# Patient Record
Sex: Female | Born: 1950 | Race: White | Hispanic: No | Marital: Married | Smoking: Former smoker
Health system: Southern US, Community
[De-identification: ages and names within clinical notes are randomized; demographics above are authoritative.]

## PROBLEM LIST (undated history)

## (undated) DIAGNOSIS — I10 Essential (primary) hypertension: Secondary | ICD-10-CM

## (undated) DIAGNOSIS — M8580 Other specified disorders of bone density and structure, unspecified site: Secondary | ICD-10-CM

## (undated) DIAGNOSIS — Z803 Family history of malignant neoplasm of breast: Secondary | ICD-10-CM

## (undated) DIAGNOSIS — I341 Nonrheumatic mitral (valve) prolapse: Secondary | ICD-10-CM

## (undated) DIAGNOSIS — K219 Gastro-esophageal reflux disease without esophagitis: Secondary | ICD-10-CM

## (undated) DIAGNOSIS — E785 Hyperlipidemia, unspecified: Secondary | ICD-10-CM

## (undated) DIAGNOSIS — Z8042 Family history of malignant neoplasm of prostate: Secondary | ICD-10-CM

## (undated) DIAGNOSIS — D1771 Benign lipomatous neoplasm of kidney: Secondary | ICD-10-CM

## (undated) DIAGNOSIS — Q615 Medullary cystic kidney: Secondary | ICD-10-CM

## (undated) DIAGNOSIS — T7840XA Allergy, unspecified, initial encounter: Secondary | ICD-10-CM

## (undated) DIAGNOSIS — M792 Neuralgia and neuritis, unspecified: Secondary | ICD-10-CM

## (undated) HISTORY — DX: Other specified disorders of bone density and structure, unspecified site: M85.80

## (undated) HISTORY — DX: Allergy, unspecified, initial encounter: T78.40XA

## (undated) HISTORY — PX: TONSILLECTOMY: SUR1361

## (undated) HISTORY — DX: Nonrheumatic mitral (valve) prolapse: I34.1

## (undated) HISTORY — DX: Family history of malignant neoplasm of breast: Z80.3

## (undated) HISTORY — DX: Essential (primary) hypertension: I10

## (undated) HISTORY — DX: Benign lipomatous neoplasm of kidney: D17.71

## (undated) HISTORY — DX: Family history of malignant neoplasm of prostate: Z80.42

## (undated) HISTORY — DX: Hyperlipidemia, unspecified: E78.5

## (undated) HISTORY — DX: Medullary cystic kidney: Q61.5

---

## 1999-06-05 ENCOUNTER — Ambulatory Visit (HOSPITAL_BASED_OUTPATIENT_CLINIC_OR_DEPARTMENT_OTHER): Admission: RE | Admit: 1999-06-05 | Discharge: 1999-06-05 | Payer: Self-pay | Admitting: *Deleted

## 2000-11-26 ENCOUNTER — Other Ambulatory Visit: Admission: RE | Admit: 2000-11-26 | Discharge: 2000-11-26 | Payer: Self-pay | Admitting: *Deleted

## 2001-12-28 ENCOUNTER — Other Ambulatory Visit: Admission: RE | Admit: 2001-12-28 | Discharge: 2001-12-28 | Payer: Self-pay | Admitting: *Deleted

## 2002-05-20 ENCOUNTER — Encounter: Payer: Self-pay | Admitting: Internal Medicine

## 2002-05-20 ENCOUNTER — Encounter: Admission: RE | Admit: 2002-05-20 | Discharge: 2002-05-20 | Payer: Self-pay | Admitting: Internal Medicine

## 2002-06-09 HISTORY — PX: CYSTOSCOPY: SUR368

## 2002-07-06 ENCOUNTER — Encounter: Admission: RE | Admit: 2002-07-06 | Discharge: 2002-07-06 | Payer: Self-pay | Admitting: Urology

## 2002-07-06 ENCOUNTER — Encounter: Payer: Self-pay | Admitting: Urology

## 2004-05-29 ENCOUNTER — Encounter: Admission: RE | Admit: 2004-05-29 | Discharge: 2004-08-27 | Payer: Self-pay | Admitting: Internal Medicine

## 2004-08-22 ENCOUNTER — Encounter (INDEPENDENT_AMBULATORY_CARE_PROVIDER_SITE_OTHER): Payer: Self-pay | Admitting: *Deleted

## 2004-08-22 ENCOUNTER — Ambulatory Visit (HOSPITAL_COMMUNITY): Admission: RE | Admit: 2004-08-22 | Discharge: 2004-08-22 | Payer: Self-pay | Admitting: Gastroenterology

## 2005-03-10 ENCOUNTER — Encounter: Admission: RE | Admit: 2005-03-10 | Discharge: 2005-03-10 | Payer: Self-pay | Admitting: Family Medicine

## 2005-03-10 ENCOUNTER — Ambulatory Visit: Payer: Self-pay | Admitting: Family Medicine

## 2005-06-05 ENCOUNTER — Ambulatory Visit: Payer: Self-pay | Admitting: Internal Medicine

## 2005-06-11 ENCOUNTER — Ambulatory Visit: Payer: Self-pay | Admitting: Internal Medicine

## 2005-06-13 ENCOUNTER — Encounter: Payer: Self-pay | Admitting: Internal Medicine

## 2005-07-01 ENCOUNTER — Ambulatory Visit: Payer: Self-pay | Admitting: Internal Medicine

## 2005-07-10 ENCOUNTER — Ambulatory Visit: Payer: Self-pay | Admitting: Internal Medicine

## 2005-09-22 ENCOUNTER — Ambulatory Visit: Payer: Self-pay | Admitting: Internal Medicine

## 2005-09-25 ENCOUNTER — Ambulatory Visit: Payer: Self-pay | Admitting: Internal Medicine

## 2005-12-12 ENCOUNTER — Ambulatory Visit: Payer: Self-pay | Admitting: Internal Medicine

## 2006-03-26 ENCOUNTER — Ambulatory Visit: Payer: Self-pay | Admitting: Internal Medicine

## 2006-03-26 LAB — CONVERTED CEMR LAB
ALT: 25 U/L
AST: 27 U/L
Chol/HDL Ratio, serum: 2.5
Cholesterol: 224 mg/dL
HDL: 88.3 mg/dL
LDL DIRECT: 110.8 mg/dL
Triglyceride fasting, serum: 51 mg/dL
VLDL: 10 mg/dL

## 2006-04-14 ENCOUNTER — Ambulatory Visit: Payer: Self-pay | Admitting: Internal Medicine

## 2006-07-20 ENCOUNTER — Ambulatory Visit: Payer: Self-pay | Admitting: Internal Medicine

## 2006-07-20 ENCOUNTER — Encounter: Admission: RE | Admit: 2006-07-20 | Discharge: 2006-07-20 | Payer: Self-pay | Admitting: Internal Medicine

## 2006-07-20 LAB — CONVERTED CEMR LAB: Uric Acid, Serum: 2.9 mg/dL (ref 2.4–7.0)

## 2006-09-15 ENCOUNTER — Encounter: Admission: RE | Admit: 2006-09-15 | Discharge: 2006-12-14 | Payer: Self-pay | Admitting: Internal Medicine

## 2006-12-18 ENCOUNTER — Encounter: Payer: Self-pay | Admitting: Internal Medicine

## 2007-01-05 ENCOUNTER — Encounter: Payer: Self-pay | Admitting: Internal Medicine

## 2007-02-16 ENCOUNTER — Telehealth: Payer: Self-pay | Admitting: Internal Medicine

## 2007-02-16 ENCOUNTER — Telehealth (INDEPENDENT_AMBULATORY_CARE_PROVIDER_SITE_OTHER): Payer: Self-pay | Admitting: *Deleted

## 2007-02-24 ENCOUNTER — Ambulatory Visit: Payer: Self-pay | Admitting: Internal Medicine

## 2007-02-27 LAB — CONVERTED CEMR LAB
Albumin: 4.3 g/dL (ref 3.5–5.2)
Basophils Absolute: 0 10*3/uL (ref 0.0–0.1)
Cholesterol: 211 mg/dL (ref 0–200)
Direct LDL: 105.1 mg/dL
Eosinophils Absolute: 0.1 10*3/uL (ref 0.0–0.6)
GFR calc Af Amer: 95 mL/min
GFR calc non Af Amer: 79 mL/min
HCT: 40.7 % (ref 36.0–46.0)
Hemoglobin: 14.1 g/dL (ref 12.0–15.0)
MCHC: 34.5 g/dL (ref 30.0–36.0)
MCV: 93 fL (ref 78.0–100.0)
Monocytes Absolute: 0.6 10*3/uL (ref 0.2–0.7)
Monocytes Relative: 10.7 % (ref 3.0–11.0)
Neutro Abs: 3 10*3/uL (ref 1.4–7.7)
Neutrophils Relative %: 57.8 % (ref 43.0–77.0)
Potassium: 3.9 meq/L (ref 3.5–5.1)
Sodium: 144 meq/L (ref 135–145)
TSH: 3.03 microintl units/mL (ref 0.35–5.50)
Total Bilirubin: 1 mg/dL (ref 0.3–1.2)

## 2007-03-01 ENCOUNTER — Encounter (INDEPENDENT_AMBULATORY_CARE_PROVIDER_SITE_OTHER): Payer: Self-pay | Admitting: *Deleted

## 2007-03-03 ENCOUNTER — Ambulatory Visit: Payer: Self-pay | Admitting: Internal Medicine

## 2007-03-03 DIAGNOSIS — E782 Mixed hyperlipidemia: Secondary | ICD-10-CM | POA: Insufficient documentation

## 2007-03-03 LAB — CONVERTED CEMR LAB
Cholesterol, target level: 200 mg/dL
HDL goal, serum: 40 mg/dL

## 2007-06-10 HISTORY — PX: TOE SURGERY: SHX1073

## 2007-07-30 ENCOUNTER — Encounter: Payer: Self-pay | Admitting: Internal Medicine

## 2008-01-05 ENCOUNTER — Encounter: Payer: Self-pay | Admitting: Internal Medicine

## 2008-01-14 ENCOUNTER — Telehealth (INDEPENDENT_AMBULATORY_CARE_PROVIDER_SITE_OTHER): Payer: Self-pay | Admitting: *Deleted

## 2008-01-14 ENCOUNTER — Ambulatory Visit: Payer: Self-pay | Admitting: Internal Medicine

## 2008-01-19 ENCOUNTER — Ambulatory Visit: Payer: Self-pay | Admitting: Internal Medicine

## 2008-01-23 LAB — CONVERTED CEMR LAB
Direct LDL: 136.2 mg/dL
Glucose, Bld: 90 mg/dL (ref 70–99)
HDL: 62.6 mg/dL (ref >39.0)

## 2008-01-24 ENCOUNTER — Encounter (INDEPENDENT_AMBULATORY_CARE_PROVIDER_SITE_OTHER): Payer: Self-pay | Admitting: *Deleted

## 2008-02-08 ENCOUNTER — Ambulatory Visit: Payer: Self-pay | Admitting: Internal Medicine

## 2008-02-08 LAB — CONVERTED CEMR LAB: HDL goal, serum: 50 mg/dL

## 2008-02-29 ENCOUNTER — Other Ambulatory Visit: Admission: RE | Admit: 2008-02-29 | Discharge: 2008-02-29 | Payer: Self-pay | Admitting: Obstetrics and Gynecology

## 2008-04-26 ENCOUNTER — Ambulatory Visit: Payer: Self-pay | Admitting: Internal Medicine

## 2008-06-13 ENCOUNTER — Ambulatory Visit: Payer: Self-pay | Admitting: Internal Medicine

## 2008-06-22 ENCOUNTER — Telehealth (INDEPENDENT_AMBULATORY_CARE_PROVIDER_SITE_OTHER): Payer: Self-pay | Admitting: *Deleted

## 2008-12-08 ENCOUNTER — Ambulatory Visit: Payer: Self-pay | Admitting: Internal Medicine

## 2008-12-08 DIAGNOSIS — M542 Cervicalgia: Secondary | ICD-10-CM

## 2008-12-13 ENCOUNTER — Ambulatory Visit: Payer: Self-pay | Admitting: Internal Medicine

## 2008-12-15 ENCOUNTER — Encounter (INDEPENDENT_AMBULATORY_CARE_PROVIDER_SITE_OTHER): Payer: Self-pay | Admitting: *Deleted

## 2009-01-04 ENCOUNTER — Encounter: Payer: Self-pay | Admitting: Internal Medicine

## 2009-01-10 ENCOUNTER — Encounter: Payer: Self-pay | Admitting: Internal Medicine

## 2009-04-26 ENCOUNTER — Telehealth: Payer: Self-pay | Admitting: Internal Medicine

## 2009-07-09 ENCOUNTER — Emergency Department (HOSPITAL_BASED_OUTPATIENT_CLINIC_OR_DEPARTMENT_OTHER): Admission: EM | Admit: 2009-07-09 | Discharge: 2009-07-09 | Payer: Self-pay | Admitting: Emergency Medicine

## 2009-07-09 ENCOUNTER — Ambulatory Visit: Payer: Self-pay | Admitting: Family Medicine

## 2009-07-09 ENCOUNTER — Ambulatory Visit: Payer: Self-pay | Admitting: Diagnostic Radiology

## 2009-08-13 ENCOUNTER — Encounter: Payer: Self-pay | Admitting: Internal Medicine

## 2009-08-14 ENCOUNTER — Telehealth (INDEPENDENT_AMBULATORY_CARE_PROVIDER_SITE_OTHER): Payer: Self-pay | Admitting: *Deleted

## 2009-09-11 ENCOUNTER — Ambulatory Visit: Payer: Self-pay | Admitting: Internal Medicine

## 2009-09-18 ENCOUNTER — Ambulatory Visit: Payer: Self-pay | Admitting: Internal Medicine

## 2009-09-18 DIAGNOSIS — I10 Essential (primary) hypertension: Secondary | ICD-10-CM | POA: Insufficient documentation

## 2009-09-18 DIAGNOSIS — M949 Disorder of cartilage, unspecified: Secondary | ICD-10-CM

## 2009-09-18 DIAGNOSIS — M899 Disorder of bone, unspecified: Secondary | ICD-10-CM | POA: Insufficient documentation

## 2009-09-18 DIAGNOSIS — F4323 Adjustment disorder with mixed anxiety and depressed mood: Secondary | ICD-10-CM

## 2009-09-18 DIAGNOSIS — J310 Chronic rhinitis: Secondary | ICD-10-CM | POA: Insufficient documentation

## 2009-09-24 LAB — CONVERTED CEMR LAB
CRP: 0.1 mg/dL (ref <0.6)
Vit D, 25-Hydroxy: 48 ng/mL (ref 30–89)

## 2009-12-04 ENCOUNTER — Encounter: Payer: Self-pay | Admitting: Internal Medicine

## 2010-01-16 ENCOUNTER — Encounter: Payer: Self-pay | Admitting: Internal Medicine

## 2010-02-08 ENCOUNTER — Ambulatory Visit: Payer: Self-pay | Admitting: Internal Medicine

## 2010-02-08 DIAGNOSIS — M25559 Pain in unspecified hip: Secondary | ICD-10-CM | POA: Insufficient documentation

## 2010-02-13 ENCOUNTER — Encounter: Payer: Self-pay | Admitting: Internal Medicine

## 2010-02-25 ENCOUNTER — Ambulatory Visit: Payer: Self-pay | Admitting: Internal Medicine

## 2010-02-25 DIAGNOSIS — R5383 Other fatigue: Secondary | ICD-10-CM

## 2010-02-25 DIAGNOSIS — K219 Gastro-esophageal reflux disease without esophagitis: Secondary | ICD-10-CM

## 2010-02-25 DIAGNOSIS — R1013 Epigastric pain: Secondary | ICD-10-CM

## 2010-02-25 DIAGNOSIS — R5381 Other malaise: Secondary | ICD-10-CM

## 2010-03-13 ENCOUNTER — Encounter: Payer: Self-pay | Admitting: Internal Medicine

## 2010-03-14 LAB — HM PAP SMEAR

## 2010-03-25 ENCOUNTER — Encounter (INDEPENDENT_AMBULATORY_CARE_PROVIDER_SITE_OTHER): Payer: Self-pay | Admitting: *Deleted

## 2010-03-25 ENCOUNTER — Ambulatory Visit: Payer: Self-pay | Admitting: Internal Medicine

## 2010-03-25 LAB — CONVERTED CEMR LAB
OCCULT 2: NEGATIVE
OCCULT 3: NEGATIVE

## 2010-04-11 ENCOUNTER — Ambulatory Visit (HOSPITAL_COMMUNITY): Admission: RE | Admit: 2010-04-11 | Discharge: 2010-04-11 | Payer: Self-pay | Admitting: Gastroenterology

## 2010-06-09 HISTORY — PX: HYSTEROSCOPY WITH RESECTOSCOPE: SHX5395

## 2010-07-02 LAB — CBC
HCT: 43.2 % (ref 36.0–46.0)
Hemoglobin: 14.6 g/dL (ref 12.0–15.0)
MCH: 29.9 pg (ref 26.0–34.0)
MCHC: 33.8 g/dL (ref 30.0–36.0)
MCV: 88.5 fL (ref 78.0–100.0)
Platelets: 199 K/uL (ref 150–400)
RBC: 4.88 MIL/uL (ref 3.87–5.11)
RDW: 12.8 % (ref 11.5–15.5)
WBC: 5.6 K/uL (ref 4.0–10.5)

## 2010-07-02 LAB — BASIC METABOLIC PANEL
BUN: 13 mg/dL (ref 6–23)
CO2: 27 mEq/L (ref 19–32)
Calcium: 9.2 mg/dL (ref 8.4–10.5)
Chloride: 105 mEq/L (ref 96–112)
Creatinine, Ser: 0.54 mg/dL (ref 0.4–1.2)
GFR calc Af Amer: >60 mL/min (ref >60)

## 2010-07-07 LAB — CONVERTED CEMR LAB
Alkaline Phosphatase: 66 units/L (ref 39–117)
Basophils Absolute: 0 10*3/uL (ref 0.0–0.1)
Basophils Absolute: 0.1 10*3/uL (ref 0.0–0.1)
Basophils Relative: 1 % (ref 0.0–3.0)
Bilirubin, Direct: 0 mg/dL (ref 0.0–0.3)
Bilirubin, Direct: 0.1 mg/dL (ref 0.0–0.3)
Calcium: 8.9 mg/dL (ref 8.4–10.5)
GFR calc non Af Amer: 90.65 mL/min (ref >60)
H Pylori IgG: NEGATIVE
HCT: 41.5 % (ref 36.0–46.0)
Hemoglobin: 14.9 g/dL (ref 12.0–15.0)
Lymphocytes Relative: 29.8 % (ref 12.0–46.0)
Lymphs Abs: 1.6 10*3/uL (ref 0.7–4.0)
MCV: 93 fL (ref 78.0–100.0)
Monocytes Absolute: 0.4 10*3/uL (ref 0.1–1.0)
Monocytes Relative: 11.6 % (ref 3.0–12.0)
Monocytes Relative: 9.5 % (ref 3.0–12.0)
Neutro Abs: 3.2 10*3/uL (ref 1.4–7.7)
Platelets: 253 10*3/uL (ref 150.0–400.0)
RBC: 4.64 M/uL (ref 3.87–5.11)
RDW: 12.6 % (ref 11.5–14.6)
Sodium: 139 meq/L (ref 135–145)
Total Bilirubin: 0.6 mg/dL (ref 0.3–1.2)
Total Protein: 6.5 g/dL (ref 6.0–8.3)

## 2010-07-08 ENCOUNTER — Ambulatory Visit (HOSPITAL_COMMUNITY)
Admission: RE | Admit: 2010-07-08 | Discharge: 2010-07-08 | Payer: Self-pay | Source: Home / Self Care | Attending: Obstetrics & Gynecology | Admitting: Obstetrics & Gynecology

## 2010-07-11 NOTE — Assessment & Plan Note (Signed)
Summary: cpx/kdc   Vital Signs:  Patient profile:   60 year old female Height:      60.25 inches Weight:      127.6 pounds BMI:     24.80 Temp:     98.6 degrees F oral Pulse rate:   56 / minute Resp:     16 per minute BP sitting:   122 / 89  (left arm) Cuff size:   regular  Vitals Entered By: Shonna Chock (September 18, 2009 11:13 AM)  CC: CPX, discuss labs (copy given). Last EKG 12/08/2008 , General Medical Evaluation Comments REVIEWED MED LIST, PATIENT AGREED DOSE AND INSTRUCTION CORRECT    Primary Care Provider:  Alfonse Flavors  CC:  CPX, discuss labs (copy given). Last EKG 12/08/2008 , and General Medical Evaluation.  History of Present Illness: Megan Downs is here for a physical; she continues to have L neck pain intermittently. No evaluation or  Rx to date.  Preventive Screening-Counseling & Management  Alcohol-Tobacco     Smoking Status: quit  Caffeine-Diet-Exercise     Does Patient Exercise: yes  Allergies (verified): No Known Drug Allergies  Past History:  Past Medical History: Hyperlipidemia: NMR 2011:LDL 126(1486/141),TG 78, HDL 70. LDL goal= < 115, ideally <85. 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 Payor Allergic rhinitis Osteopenia; Scoliosis  Past Surgical History: Colonoscopy negative  2005 , Dr Reece Agar; cystoscopy for microscopic hematuria; G2 P2 Tonsillectomy Toe surgery 11/2007, Dr Lajoyce Corners  Family History: Father:esophageal cancer  Mother: breast cancer Siblings: 1 bro: prostate cancer; 2 bro: lung cancer(1 had been in [place]); daughter : Cystic Fibrosis Carrier; CAD in 3 GP, none premature  Social History: Occupation:Small Business Owner Married Former Smoker: 1.5 years Alcohol use-yes:socially Regular exercise-yes: yoga  Review of Systems  The patient denies anorexia, fever, weight loss, weight gain, vision loss, decreased hearing, hoarseness, chest pain, syncope, dyspnea on exertion,  peripheral edema, prolonged cough, hemoptysis, abdominal pain, melena, hematochezia, severe indigestion/heartburn, suspicious skin lesions, unusual weight change, abnormal bleeding, enlarged lymph nodes, and angioedema.         Headaches & dry cough with pollen exposure; Claritin helps. GU:  Denies discharge, dysuria, hematuria, incontinence, nocturia, and urinary frequency. MS:  Complains of joint pain and low back pain; denies joint redness, joint swelling, and loss of strength. Neuro:  Denies brief paralysis, tingling, and weakness; Numbness L hip after prolonged sitting. No LUE radiculopathy symptoms. Psych:  Complains of anxiety, depression, easily angered, easily tearful, and irritability; Major stress of caring for demented Mother-in -law.  Physical Exam  General:  well-nourished; alert,appropriate and cooperative throughout examination Head:  Normocephalic and atraumatic without obvious abnormalities. Eyes:  No corneal or conjunctival inflammation noted. Perrla. Funduscopic exam benign, without hemorrhages, exudates or papilledema. Vision grossly normal. Ears:  External ear exam shows no significant lesions or deformities.  Otoscopic examination reveals clear canals, tympanic membranes are intact bilaterally without bulging, retraction, inflammation or discharge. Hearing is grossly normal bilaterally. Nose:  External nasal examination shows no deformity or inflammation. Nasal mucosa are pink and moist without lesions or exudates. Slight septal dislocation Mouth:  Oral mucosa and oropharynx without lesions or exudates.  Teeth in good repair. Neck:  No deformities, masses, or tenderness noted. Lungs:  Normal respiratory effort, chest expands symmetrically. Lungs are clear to auscultation, no crackles or wheezes. Heart:  normal rate, regular rhythm, no gallop, no rub, no JVD, no HJR, and grade 1 /6 systolic murmur.  Abdomen:  Bowel sounds positive,abdomen soft and non-tender without masses,  organomegaly or hernias noted. Genitalia:  [facility], Shirlyn Goltz , NP Msk:  Reverse thoracic curvature; L paraspinus muscles larger than R Pulses:  R and L carotid,radial,dorsalis pedis and posterior tibial pulses are full and equal bilaterally Extremities:  No clubbing, cyanosis, edema, or deformity noted with normal full range of motion of all joints.   minor crepitus L knee Neurologic:  alert & oriented X3, strength normal in all extremities, and DTRs symmetrical and normal.   Skin:  Intact without suspicious lesions or rashes Cervical Nodes:  No lymphadenopathy noted Axillary Nodes:  No palpable lymphadenopathy Psych:  memory intact for recent and remote, normally interactive, good eye contact, and not depressed appearing.  Appropriate response to home situation   Impression & Recommendations:  Problem # 1:  PREVENTIVE HEALTH CARE (ICD-V70.0)  Orders: Radiology Referral (Radiology) Venipuncture [phone]) T-Vitamin D (25-Hydroxy) [phone]) T- * Misc. Laboratory test [phone])  Problem # 2:  HYPERTENSION (ICD-401.9)  Her updated medication list for this problem includes:    Metoprolol Tartrate 25 Mg Tabs (Metoprolol tartrate) .Marland Kitchen... 1 two times a day to keep bp < 135/85 on average  Problem # 3:  HYPERLIPIDEMIA (ICD-272.2)  Orders: T- * Misc. 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 1 by mouth 6)  Metoprolol Tartrate 25 Mg Tabs (Metoprolol tartrate) .Marland Kitchen.. 1 two times a day to keep bp < 135/85 on average 7)  Cymbalta 60 Mg Cpep (Duloxetine hcl) .Marland Kitchen.. 1 once daily  Other Orders: Tdap => 34yrs IM  (44010) Admin 1st Vaccine (27253)  Patient Instructions: 1)  Check your Blood Pressure regularly. If it is above: 135/85 ON AVERAGE you should make an appointment.Verify colonoscopy due date with Dr Henriette Combs office. Prescriptions: CYMBALTA 60 MG CPEP (DULOXETINE HCL) 1 once daily  #30 x 5   Entered and Authorized by:   Marga Melnick MD   Signed by:   Marga Melnick MD on 09/18/2009   Method used:   Print then Give to Patient   RxID:   [id number] METOPROLOL TARTRATE 25 MG TABS (METOPROLOL TARTRATE) 1 two times a day to keep BP < 135/85 ON AVERAGE  #180 x 3   Entered and Authorized by:   Marga Melnick MD   Signed by:   Marga Melnick MD on 09/18/2009   Method used:   Faxed to ...       [facility]  [address] [phone]* (retail)       [address]       [place], [place]  [address]       Ph: [phone]       Fax: [phone]   RxID:   [id number]    Immunizations Administered:  Tetanus Vaccine:    Vaccine Type: Tdap    Site: right deltoid    Mfr: GlaxoSmithKline    Dose: 0.5 ml    Route: IM    Given by: Chrae Malloy    Exp. Date: 09/01/2011    Lot #: [id number]    VIS given: 04/27/07 version given September 18, 2009.

## 2010-07-11 NOTE — Assessment & Plan Note (Signed)
Summary: REFLUX--NEEDS REFERRAL.///SPH   Vital Signs:  Patient profile:   60 year old female Weight:      128.2 pounds Temp:     98.6 degrees F oral Pulse rate:   72 / minute Resp:     13 per minute BP sitting:   112 / 70  (left arm) Cuff size:   regular  Vitals Entered By: Shonna Chock CMA (February 25, 2010 10:43 AM) CC: 1.) Reflux  2.) Discuss Metoprolol, Heartburn   Primary Care Provider:  Alfonse Flavors  CC:  1.) Reflux  2.) Discuss Metoprolol and Heartburn.  History of Present Illness:      This is a 60 year old woman who presents with "acid reflux" for 6-8 months, worse inpast 2 months.  The patient reports  epigastric pain,  chest pain , and trouble swallowing, but denies sour taste in mouth, weight loss or  weight gain. She has had some symptoms with physical exertion. The patient reports the following alarm features of dyspepsia: dysphagia as "food sticking " X 2 .  The patient denies the following alarm features: melena, hematemesis, and vomiting.  Symptoms are worse with alcohol & waist flexion or lifting heavy items.No prior evaluation to date.  The patient has found the following treatments to be effective: elevating the head of bed.  Colonoscopy was negative by Dr Laural Benes in 08/2009.  Hypertension Follow-Up      The patient also presents for Hypertension follow-up.  The patient reports urinary frequency and fatigue, but denies lightheadedness and headaches.  The patient denies the following associated symptoms: exercise intolerance, dyspnea, palpitations, leg edema, and pedal edema.  The patient reports exercising daily.  Adjunctive measures currently used by the patient include salt restriction.  She has cut Metoprolol to 25 mg to 1/2 two times a day. BP averages < 120/85.  Current Medications (verified): 1)  Omega 3 2)  Calcium and Magnesium 3)  Vit D 4)  Asa 81mg  5)  Vitamin E 400 Unit Caps (Vitamin E) .Marland Kitchen.. 1 By Mouth 6)  Metoprolol Tartrate 25 Mg Tabs (Metoprolol Tartrate)  .... 1/2 Two Times A Day To Keep Bp < 135/85 On Average 7)  Cymbalta 60 Mg Cpep (Duloxetine Hcl) .Marland Kitchen.. 1 Once Daily 8)  Tramadol Hcl 50 Mg Tabs (Tramadol Hcl) 9)  Meloxicam 7.5 Mg Tabs (Meloxicam) .Marland Kitchen.. 1 Two Times A Day As Needed Hippain  Allergies (verified): No Known Drug Allergies  Physical Exam  General:  well-nourished,in no acute distress; alert,appropriate and cooperative throughout examination Eyes:  No corneal or conjunctival inflammation noted. Minimal lid lag.Perrla. No icterus Mouth:  Oral mucosa and oropharynx without lesions or exudates.  Teeth in good repair. No pharyngeal erythema.   Neck:  No deformities, masses, or tenderness noted. Lungs:  Normal respiratory effort, chest expands symmetrically. Lungs are clear to auscultation, no crackles or wheezes. Heart:  Normal rate and regular rhythm. S1 and S2 normal without gallop, murmur, click, rub.S4 Abdomen:  Bowel sounds positive,abdomen soft and non-tender without masses, organomegaly or hernias noted. Pulses:  R and L carotid,radial,dorsalis pedis and posterior tibial pulses are full and equal bilaterally Extremities:  No clubbing, cyanosis, edema. Neurologic:  alert & oriented X3 and DTRs symmetrical and normal.   Skin:  Intact without suspicious lesions or rashes. No jaundice Cervical Nodes:  No lymphadenopathy noted Axillary Nodes:  No palpable lymphadenopathy Psych:  memory intact for recent and remote, normally interactive, and good eye contact.     Impression & Recommendations:  Problem #  1:  GERD (ICD-530.81)  Orders: Venipuncture (16109) TLB-H. Pylori Abs(Helicobacter Pylori) (86677-HELICO) Specimen Handling (60454)  Her updated medication list for this problem includes:    Ranitidine Hcl 150 Mg Tabs (Ranitidine hcl) .Marland Kitchen... 1 two times a day pre meals  Problem # 2:  HYPERTENSION (ICD-401.9)  Her updated medication list for this problem includes:    Metoprolol Tartrate 25 Mg Tabs (Metoprolol tartrate)  .Marland Kitchen... 1/2 two times a day to keep bp < 135/85 on average  Orders: Venipuncture (09811)  Problem # 3:  FATIGUE (ICD-780.79)  ? from Beta blocker  Orders: Venipuncture (91478) TLB-CBC Platelet - w/Differential (85025-CBCD) TLB-TSH (Thyroid Stimulating Hormone) (84443-TSH) Specimen Handling (29562)  Problem # 4:  ABDOMINAL PAIN, EPIGASTRIC (ICD-789.06)  due to #1  Orders: EKG w/ Interpretation (93000) Venipuncture (13086) TLB-CBC Platelet - w/Differential (85025-CBCD) TLB-Hepatic/Liver Function Pnl (80076-HEPATIC) TLB-Amylase (82150-AMYL) TLB-Lipase (83690-LIPASE) TLB-H. Pylori Abs(Helicobacter Pylori) (86677-HELICO) Specimen Handling (57846)  Complete Medication List: 1)  Omega 3  2)  Calcium and Magnesium  3)  Vit D  4)  Asa 81mg   5)  Vitamin E 400 Unit Caps (Vitamin e) .Marland Kitchen.. 1 by mouth 6)  Metoprolol Tartrate 25 Mg Tabs (Metoprolol tartrate) .... 1/2 two times a day to keep bp < 135/85 on average 7)  Cymbalta 60 Mg Cpep (Duloxetine hcl) .Marland Kitchen.. 1 once daily 8)  Tramadol Hcl 50 Mg Tabs (Tramadol hcl) 9)  Meloxicam 7.5 Mg Tabs (Meloxicam) .Marland Kitchen.. 1 two times a day as needed hippain 10)  Ranitidine Hcl 150 Mg Tabs (Ranitidine hcl) .Marland Kitchen.. 1 two times a day pre meals  Patient Instructions: 1)  Complete stool cards.  2)  Avoid foods high in acid (tomatoes, citrus juices, spicy foods). Avoid eating within two hours of lying down or before exercising. Do not over eat; try smaller more frequent meals. Elevate head of bed twelve inches when sleeping. Prescriptions: RANITIDINE HCL 150 MG TABS (RANITIDINE HCL) 1 two times a day pre meals  #60 x 2   Entered and Authorized by:   Marga Melnick MD   Signed by:   Marga Melnick MD on 02/25/2010   Method used:   Faxed to ...       [facility]  [address] [phone]* (retail)       [address]       [place], [place]  [address]       Ph: [phone]       Fax: [phone]   RxID:   [id number]

## 2010-07-11 NOTE — Procedures (Signed)
Summary: Colonoscopy/[facility]  Colonoscopy/[facility]   Imported By: Lanelle Bal 12/19/2009 09:35:05  _____________________________________________________________________  External Attachment:    Type:   Image     Comment:   External Document

## 2010-07-11 NOTE — Assessment & Plan Note (Signed)
Summary: DISCUSS BONE DENSITY/SCM   Vital Signs:  Patient profile:   60 year old female Weight:      128.8 pounds Pulse rate:   60 / minute Resp:     14 per minute BP sitting:   104 / 68  (left arm) Cuff size:   regular  Vitals Entered By: Shonna Chock CMA (February 08, 2010 4:15 PM) CC: Discuss Bone Density (copy given), Lower Extremity Joint pain   Primary Care Provider:  Alfonse Flavors  CC:  Discuss Bone Density (copy given) and Lower Extremity Joint pain.  History of Present Illness: BMD reviewed ; T score -1.9 @ L femoral neck . No PMH of fractures. Fosamax was Rxed by Gyn ? 2001 but never taken . Yoga  & treadmill  exercise 3X/week. Last vitamin D level was 46 in 2009. Calcium 600 mg two times a day & ? amount vit D3.Pathophysiology of Osteoporosis.She has concerns about GI effects of bisphosphonates. Lower Extremity Joint Pain      This is a 60 year old woman who presents with L Hip Joint pain X 3-4 months.  The patient reports weakness, but denies swelling, redness, giving away, locking, popping, stiffness for >1 hr, and decreased ROM.  The pain began gradually and with no injury.  The pain is described as dull.  Evaluation to date has included no evaluation.  The patient denies the following symptoms: fever, rash, photosensitivity, eye symptoms, diarrhea, and dysuria. Rx: none.  Hypertension Follow-Up      The patient also presents for Hypertension follow-up. BP low despite decreasing Beta blocker to 25 mg daily. The patient reports waves of tiredness, but denies lightheadedness, urinary frequency, and headaches.  The patient denies the following associated symptoms: chest pain, chest pressure, exercise intolerance, dyspnea, palpitations, syncope, leg edema, and pedal edema.  Adjunctive measures currently used by the patient include salt restriction.    Current Medications (verified): 1)  Omega 3 2)  Calcium and Magnesium 3)  Vit D 4)  Asa 81mg  5)  Vitamin E 400 Unit Caps (Vitamin E)  .Marland Kitchen.. 1 By Mouth 6)  Metoprolol Tartrate 25 Mg Tabs (Metoprolol Tartrate) .Marland Kitchen.. 1 Two Times A Day To Keep Bp < 135/85 On Average 7)  Cymbalta 60 Mg Cpep (Duloxetine Hcl) .Marland Kitchen.. 1 Once Daily  Allergies (verified): No Known Drug Allergies  Past History:  Past Medical History: Hyperlipidemia: NMR 2011:LDL 126(1486/141),TG 78, HDL 70. LDL goal= < 115, ideally <85. 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 Payor Allergic rhinitis Osteopenia :BMD  2011  T score - 1.9 @ femoral neck; Scoliosis  Physical Exam  General:  well-nourished,in no acute distress; alert,appropriate and cooperative throughout examination Lungs:  Normal respiratory effort, chest expands symmetrically. Lungs are clear to auscultation, no crackles or wheezes. Heart:  normal rate, regular rhythm, no gallop, no rub, no JVD, no HJR, and grade 1 /6 systolic murmur.   Abdomen:  Bowel sounds positive,abdomen soft and non-tender without masses, organomegaly or hernias noted. No AAA or bruits Msk:  Scoliosis Pulses:  R and L carotid,radial,dorsalis pedis and posterior tibial pulses are full and equal bilaterally Extremities:  No clubbing, cyanosis, edema. Excellent &  full range of motion of all joints.    Psych:  memory intact for recent and remote, normally interactive, and good eye contact.     Impression & Recommendations:  Problem # 1:  OSTEOPENIA (ICD-733.90) Stable to minimally progressive; concerns re: bisphosphonate therapy  Problem # 2:  HYPERTENSION (ICD-401.9) BP actually low with symptoms Her updated medication list for this problem includes:    Metoprolol Tartrate 25 Mg Tabs (Metoprolol tartrate) .Marland Kitchen... 1 two times a day to keep bp < 135/85 on average  Problem # 3:  HIP PAIN, LEFT (ICD-719.45)  Her updated medication list for this problem includes:    Tramadol Hcl 50 Mg Tabs (Tramadol hcl)    Meloxicam 7.5 Mg Tabs (Meloxicam) .Marland Kitchen... 1 two times a day as  needed hippain  Orders: T-Hip Comp Left Min 2-views (73510TC)  Complete Medication List: 1)  Omega 3  2)  Calcium and Magnesium  3)  Vit D  4)  Asa 81mg   5)  Vitamin E 400 Unit Caps (Vitamin e) .Marland Kitchen.. 1 by mouth 6)  Metoprolol Tartrate 25 Mg Tabs (Metoprolol tartrate) .Marland Kitchen.. 1 two times a day to keep bp < 135/85 on average 7)  Cymbalta 60 Mg Cpep (Duloxetine hcl) .Marland Kitchen.. 1 once daily 8)  Tramadol Hcl 50 Mg Tabs (Tramadol hcl) 9)  Meloxicam 7.5 Mg Tabs (Meloxicam) .Marland Kitchen.. 1 two times a day as needed hippain  Patient Instructions: 1)  BMD every 25 months; vit D level annually. Take @ least 1000 International Units  vit D3 once daily . Prescriptions: MELOXICAM 7.5 MG TABS (MELOXICAM) 1 two times a day as needed hippain  #30 x 1   Entered and Authorized by:   Marga Melnick MD   Signed by:   Marga Melnick MD on 02/08/2010   Method used:   Print then Give to Patient   RxID:   [id number]

## 2010-07-11 NOTE — Letter (Signed)
Summary: [facility]  [facility]   Imported By: Lanelle Bal 02/25/2010 10:40:01  _____________________________________________________________________  External Attachment:    Type:   Image     Comment:   External Document

## 2010-07-11 NOTE — Letter (Signed)
Summary: [facility]  [facility]   Imported By: Lanelle Bal 08/27/2009 12:24:09  _____________________________________________________________________  External Attachment:    Type:   Image     Comment:   External Document

## 2010-07-11 NOTE — Progress Notes (Signed)
Summary: cpx labs  Phone Note Call from Patient   Caller: Patient Complaint: Chest Pain Summary of Call: patient has a cpx schedule on april 14,2011 and labs on april 5,2011. need cpx labs orders please. Initial call taken by: Barb Merino,  August 14, 2009 10:30 AM  Follow-up for Phone Call        NMR Lipoprofile Lipid  Panel, VIT-D,CBCD, BMP,HEP,TSH, 272.4,733.90,V70.0..............Marland KitchenFelecia Deloach CMA  August 14, 2009 10:48 AM     Additional Follow-up for Phone Call Additional follow up Details #2::    labs are acheduled.Marland KitchenMarland KitchenMarland KitchenBarb Merino  August 14, 2009 11:08 AM  Follow-up by: Barb Merino,  August 14, 2009 11:08 AM

## 2010-07-11 NOTE — Consult Note (Signed)
Summary[facility]oraha [facility]s IM @ [facility]   Imported By: Lanelle Bal 03/26/2010 16:29:58  _____________________________________________________________________  External Attachment:    Type:   Image     Comment:   External Document

## 2010-07-11 NOTE — Assessment & Plan Note (Signed)
Summary: Andi Hence ACUTE--PAIN LEFT WAIST AREA, THROWING UP--OK PER DANIE...   Vital Signs:  Patient profile:   60 year old female Height:      61 inches Weight:      132 pounds BMI:     25.03 Temp:     97.5 degrees F oral Pulse rate:   67 / minute Pulse rhythm:   regular BP sitting:   130 / 86  (left arm) Cuff size:   regular  Vitals Entered By: Army Fossa CMA (July 09, 2009 11:52 AM) CC: Pt c/o left side pain- states she thought she had a UTI this weekend but that went away. Now unbearable pain in her side around to her back.    History of Present Illness: Pt walked in with c/o severe abd and back pain that came on suddenly this am.   Pt c/o L flank pain and LLQ pain that is severe.   Husband came in later and stated that she has a high tolerance to pain and he has never seen her in this much agony.   Urine was done which showed blood----pt states she always has blood---pt sent to ER for evaluation  Allergies (verified): No Known Drug Allergies   Complete Medication List: 1)  Omega 3  2)  Calcium and Magnesium  3)  Vit D  4)  Asa 81mg   5)  Vitamin E 400 Unit Caps (Vitamin e) .Marland Kitchen.. 1 by mouth 6)  Metoprolol Tartrate 25 Mg Tabs (Metoprolol tartrate) .Marland Kitchen.. 1 two times a day to keep bp < 135/85 on average  Other Orders: No Charge Patient Arrived (NCPA0) (NCPA0) UA Dipstick w/o Micro (manual) (16109)  Appended Document: WALKIN ACUTE--PAIN LEFT WAIST AREA, THROWING UP--OK PER DANIE...     Allergies: No Known Drug Allergies  Physical Exam  General:  pale and severe distress.   Abdomen:  soft and non-tender to palpation but pt c/o severe pain L flank around to LLQ---  Pt unable to lay still for exam    Impression & Recommendations:  Problem # 1:  ABDOMINAL PAIN OTHER SPECIFIED SITE (ICD-789.09) Son felt she needed to go to hospital and didn't understand why she came here Pt was unable to cooperate with exam because of pain Pt sent to er for evaluation  Complete  Medication List: 1)  Omega 3  2)  Calcium and Magnesium  3)  Vit D  4)  Asa 81mg   5)  Vitamin E 400 Unit Caps (Vitamin e) .Marland Kitchen.. 1 by mouth 6)  Metoprolol Tartrate 25 Mg Tabs (Metoprolol tartrate) .Marland Kitchen.. 1 two times a day to keep bp < 135/85 on average

## 2010-07-11 NOTE — Letter (Signed)
Summary: Results Follow up Letter  [facility]  [address] [place], [place] [address]   Phone: [phone]  Fax: [phone]    03/25/2010 MRN: [id number]  Megan Downs [address] [place], [place]  [address]  Dear Ms. Heppler,  The following are the results of your recent test(s):  Test         Result    Pap Smear:        Normal _____  Not Normal _____ Comments: ______________________________________________________ Cholesterol: LDL(Bad cholesterol):         Your goal is less than:         HDL (Good cholesterol):       Your goal is more than: Comments:  ______________________________________________________ Mammogram:        Normal _____  Not Normal _____ Comments:  ___________________________________________________________________ Hemoccult:        Normal __X___  Not normal _______ Comments:    _____________________________________________________________________ Other Tests:    We routinely do not discuss normal results over the telephone.  If you desire a copy of the results, or you have any questions about this information we can discuss them at your next office visit.   Sincerely,

## 2010-08-18 NOTE — Op Note (Signed)
Megan Downs, Megan Downs             ACCOUNT NO.:  [id number]  MEDICAL RECORD NO.:  [id number]          PATIENT TYPE:  AMB  LOCATION:  SDC                           FACILITY:  [facility]  PHYSICIAN:  M. Leda Quail, MD  DATE OF BIRTH:  1950-10-06  DATE OF PROCEDURE: DATE OF DISCHARGE:                              OPERATIVE REPORT   PREOPERATIVE DIAGNOSES: 45. A 60 year old G2 P2 married white female white female with endometrial polyp. 2. Stenotic cervix. 3. History of renal tubular ectasia.  POSTOPERATIVE DIAGNOSES: 71. A 60 year old G2 P2 married white female white female white female with endometrial polyp. 2. Stenotic cervix. 3. History of renal tubular ectasia.  PROCEDURE:  Hysteroscopy, polypectomy, and D&C.  SURGEON:  M. Leda Quail, MD  ASSISTANT:  OR staff.  ANESTHESIA:  LMA, Dr. Arby Barrette oversaw the case.  FINDINGS:  Endometrium of the flap, polyp on the posterior aspect of the uterus.  SPECIMENS:  Polyp and curettings to Pathology.  ESTIMATED BLOOD LOSS:  Minimal.  FLUIDS:  One liter of LR.  URINE OUTPUT:  30 mL, drained with an I and O catheterization at the beginning of the procedure.  FLUID DEFICIT:  40 mL 1.5% glycine.  COMPLICATIONS:  None.  INDICATIONS:  Megan Downs is a very nice 60 year old G2 P2 married white female P2 married white female who underwent a transvaginal ultrasound on January 4.  She was noted at that time to have distended endometrial cavity with thin- appearing endometrium with a 7 x 6 x 2 mm polyp.  The uterus measured 5.5  x 4.0 x 2.4 cm.  Because of these findings, we discussed 2 options either following this issue, is having postmenopausal bleeding or other symptoms or dilation of the cervix under anesthesia with removal of the polyp.  Risks and benefits were explained and she decided to proceed. This is all documented in her office chart.  PROCEDURE:  The patient was taken to operating room today.  Informed consent present on chart.  She was placed in supine position.  LMA  was administered by the anesthesia staff without difficulty.  Legs positioned in [place] stirrups with SCDs on lower extremities bilaterally. Legs were lifted to the high lithotomy position.  Perineum, inner thighs, and vagina were prepped and draped in normal sterile fashion. Time-out was performed.  Speculum was placed in vagina.  The anterior lip of the cervix was grasped with single-tooth tenaculum.  Using Milex dilator, the cervix was dilated.  Paracervical block of 1% lidocaine mixed 1:1 with epinephrine (1:100,000 units) was instilled.  2.5 mL were instilled at 3, 6, 9, and 12 o'clock positions respectively.  The cervix was dilated with Shawnie Pons dilators to a #21.  A 3-mm diagnostic hysteroscope was used to visualize endometrial cavity and the tubal ostia bilaterally.  A flat polyp was noted on the posterior aspect of the uterine cavity.  A #1 rough curette was used to curette the endometrial cavity till rough gritty texture was noted to all quadrants.  With relooking with the hysteroscope, the polyp was hanging just by small amount of tissue.  At this point, the polyp forceps was used to grab this and the endometrial cavity was revisualized with the hysteroscope.  The polyp was removed at this point and a good curetting of the entire endometrium was noted.  At this point, the hysteroscope was removed.  The tenaculum was removed from the cervix.  There was minimal bleeding noted.  The speculum was removed from the vagina.  Polyp and curettings were sent to Pathology together.  Sponge, lap, needle, and instrument counts were correct x2. The patient was awakened from anesthesia and taken to recovery room in stable condition.     Lum Keas, MD     MSM/MEDQ  D:  07/08/2010  T:  07/08/2010  Job:  [id number]  Electronically Signed by Paul Half MD on 08/18/2010 02:59:30 PM

## 2010-08-26 LAB — BASIC METABOLIC PANEL
Calcium: 9.9 mg/dL (ref 8.4–10.5)
Chloride: 102 mEq/L (ref 96–112)
Creatinine, Ser: 0.7 mg/dL (ref 0.4–1.2)
GFR calc Af Amer: >60 mL/min (ref >60)
GFR calc non Af Amer: >60 mL/min (ref >60)

## 2010-08-26 LAB — CBC
MCV: 92.8 fL (ref 78.0–100.0)
RBC: 4.9 MIL/uL (ref 3.87–5.11)
WBC: 9.5 10*3/uL (ref 4.0–10.5)

## 2010-08-26 LAB — DIFFERENTIAL
Lymphocytes Relative: 26 % (ref 12–46)
Lymphs Abs: 2.5 10*3/uL (ref 0.7–4.0)
Monocytes Relative: 9 % (ref 3–12)
Neutro Abs: 5.9 10*3/uL (ref 1.7–7.7)
Neutrophils Relative %: 62 % (ref 43–77)

## 2010-08-26 LAB — URINALYSIS, ROUTINE W REFLEX MICROSCOPIC
Glucose, UA: NEGATIVE mg/dL
Specific Gravity, Urine: 1.016 (ref 1.005–1.030)
pH: 7 (ref 5.0–8.0)

## 2010-08-26 LAB — URINE MICROSCOPIC-ADD ON

## 2010-08-26 LAB — URINE CULTURE

## 2010-10-21 ENCOUNTER — Other Ambulatory Visit: Payer: Self-pay | Admitting: Internal Medicine

## 2010-10-25 NOTE — Assessment & Plan Note (Signed)
[facility]                        [name]   NAME:[name], [name]                    MRN:          [id number]  DATE:07/20/2006                            DOB:          07/20/1950    Megan Downs has been having progressive pain over the last several  months with exacerbation the first week in February.  It is not affected  by sitting or walking.  She relates an injury possibly 20 years ago as  the initial presentation when a pallet of lime was placed on her foot.  She has had decreased mobility of the toe since that injury.   The pain now is of the great toe joint, but it can involve the entire  ball of the foot; it can extend into the arch.  Flip-flops definitely  aggravate it.  It is a constant ache for the most part, but she has also  had variable quality & intensity of the pain with throbbing at times.   At most times the pain mimics the pain she experienced at the time of  the injury.   She has a history of scoliosis.   Maternal grandmother had arthritis of the hands, and mother had back  pain.   History of osteopenia on bone mineral density study with a -1.5 at the  femoral neck.   1. She is on an enteric-coated aspirin 81 mg daily.  2. Tricor 145 mg daily.  3. Calcium.  4. Vitamin D.   She has no known drug allergies.   She drinks socially and has never smoked.   Constitutional symptoms are negative.   Weight was down 3 pounds from 127, pulse 56, respiratory rate 16 and  blood pressure 100/74.  Pedal pulses were intact.  There was no edema.  She had minor degenerative changes in her hands.  There was marked  tenderness to the right big toe at the metatarsophalangeal joint with  extension or palpation.   She was placed on Indocin 50 mg 3 times a day with food and arch  supports were recommended.  Her film reveals degenerative changes but no  erosions.  Her uric acid was 2.9 which mitigates against  gout.   She called on March 15 stating that the symptoms were persisting;  referral to a podiatrist will be pursued.     Titus Dubin. Alwyn Ren, MD,FACP,FCCP  Electronically Signed    [facility]/MedQ  DD: 08/24/2006  DT: 08/24/2006  Job #: [id number]

## 2010-10-25 NOTE — Op Note (Signed)
[name], Downs             ACCOUNT NO.:  [id number]   MEDICAL RECORD NO.:  [id number]          PATIENT TYPE:  AMB   LOCATION:  ENDO                         FACILITY:  [facility]   PHYSICIAN:  Danise Edge, M.D.   DATE OF BIRTH:  1951-02-03   DATE OF PROCEDURE:  08/22/2004  DATE OF DISCHARGE:                                 OPERATIVE REPORT   .   PROCEDURE INDICATIONS:  Ms. Megan Downs is a 60 year old female born  12/05/1950.  Ms. Megan Downs is scheduled to undergo her first screening  colonoscopy with polypectomy to prevent colon cancer.   ENDOSCOPIST:  Danise Edge, M.D.   PREMEDICATION:  Versed 7.5 mg, Demerol 70 mg.   PROCEDURE:  After obtaining informed consent,  Ms. Megan Downs was placed in the  left lateral decubitus position.  I administered intravenous Demerol and  intravenous Versed to achieve conscious sedation for the procedure.  The  patient's blood pressure, oxygen saturation and cardiac rhythm were  monitored throughout the procedure and documented in the medical record.   Anal inspection and digital rectal exam were normal.  The Olympus adjustable  pediatric colonoscope was introduced into the rectum and easily advanced to  the cecum.  Colonic preparation for the exam today was excellent.  Rectum:  From the distal rectum, a 1-mm yellowish sessile polyp was removed with the  cold biopsy forceps.  Sigmoid colon and descending colon:  At 20 cm from the  anal verge, a 1-mm sessile polyp was removed with cold biopsy forceps.  Splenic flexure normal.  Transverse colon normal.  Hepatic flexure normal.  Ascending colon normal.  Cecum and ileocecal valve normal.   ASSESSMENT:  A diminutive polyp was removed from the distal sigmoid colon  and a diminutive polyp was removed from the distal rectum.  Otherwise, Ms.  Megan Downs's screening proctocolonoscopy to the cecum was normal.      MJ/MEDQ  D:  08/22/2004  T:  08/22/2004  Job:  [id number]   cc:   Darius Bump,  M.D.  [place]  [place] [address]  Fax: [phone]

## 2010-12-05 ENCOUNTER — Telehealth: Payer: Self-pay | Admitting: Internal Medicine

## 2010-12-05 NOTE — Telephone Encounter (Signed)
V70.0 panel ( BMET, Lipids, hepatic panel, CBC& dif, TSH)

## 2010-12-05 NOTE — Telephone Encounter (Signed)
Patient has cpx scheduled [id number] -- lab [phone] need lab order

## 2010-12-09 NOTE — Telephone Encounter (Signed)
Lab order added to appt °

## 2010-12-10 ENCOUNTER — Encounter: Payer: Self-pay | Admitting: Internal Medicine

## 2010-12-13 ENCOUNTER — Ambulatory Visit (INDEPENDENT_AMBULATORY_CARE_PROVIDER_SITE_OTHER): Payer: BC Managed Care – PPO | Admitting: Internal Medicine

## 2010-12-13 ENCOUNTER — Encounter: Payer: Self-pay | Admitting: Internal Medicine

## 2010-12-13 VITALS — BP 132/86 | HR 56 | Temp 98.4°F | Wt 128.2 lb

## 2010-12-13 DIAGNOSIS — M419 Scoliosis, unspecified: Secondary | ICD-10-CM

## 2010-12-13 DIAGNOSIS — M4120 Other idiopathic scoliosis, site unspecified: Secondary | ICD-10-CM

## 2010-12-13 DIAGNOSIS — M5416 Radiculopathy, lumbar region: Secondary | ICD-10-CM

## 2010-12-13 DIAGNOSIS — IMO0002 Reserved for concepts with insufficient information to code with codable children: Secondary | ICD-10-CM

## 2010-12-13 DIAGNOSIS — M940 Chondrocostal junction syndrome [Tietze]: Secondary | ICD-10-CM

## 2010-12-13 NOTE — Patient Instructions (Signed)
See Plan & Recommendations

## 2010-12-13 NOTE — Progress Notes (Signed)
Subjective:    Patient ID: Megan Downs, female    DOB: 03-Feb-1951, 60 y.o.   MRN: [id number]  HPI  #1 CHEST PAIN Location: L inferior rib cage  Quality: dull  Duration: constant  Onset :3 weeks ago after carrying 25 # grandchild over several days Radiation: no  Better with: Cymbalta  Worse with: supne Symptoms History of Trauma/lifting: yes, see above  Nausea/vomiting: no  Diaphoresis: no  Shortness of breath: no  Pleuritic: no, only day after lifting stopped  Cough: no new cough  Orthopnea: no  PND: no  Dizziness: no  Palpitations: no  Syncope: no  Indigestion: no   Red Flags Worse with exertion: no  Recent Immobility: no    Tearing/radiation to back: no    #2 Hip pain Location: R hip/groin Onset:3 mos ago Trigger/injury:no Pain quality:sharp Pain severity:up to 9 Duration:seconds Radiation:no Exacerbating factors:no definite factors Treatment/response:no Review of systems: Constitutional: no fever, chills, sweats, change in weight  Musculoskeletal:no  muscle cramps or pain; no  joint stiffness, redness, or swelling Skin:no rash, color change Neuro: weakness with pain; incontinence (stool/urine); numbness and tingling Heme:no lymphadenopathy; abnormal bruising or bleeding       Review of Systems     Objective:   Physical Exam  Gen.: Healthy and well-nourished in appearance. Alert, appropriate and cooperative throughout exam.  Neck: No deformities, masses, or tenderness noted. Range of motion  normal Lungs: Normal respiratory effort; chest expands symmetrically. Lungs are clear to auscultation without rales, wheezes, or increased work of breathing. There is no pain to chest wall compression or deep palpation. Heart: Normal rate and rhythm. Normal S1 and S2. No gallop, click, or rub. Grade 1/2-1 systolic  Murmur.  Abdomen: Bowel sounds normal; abdomen soft and nontender. No masses, organomegaly or hernias noted.                                                Musculoskeletal/extremities: Scoliosis noted of  the thoracic or lumbar spine. No clubbing, cyanosis, edema, or deformity noted. Range of motion  normal .Tone & strength  normal.Joints normal. Nail health  good. She lay back and set up without help. Straight leg raising was negative to 90+ degrees. Vascular: Carotid, radial artery, dorsalis pedis and  posterior tibial pulses are full and equal. No bruits present. Neurologic: Alert and oriented x3. Deep tendon reflexes symmetrical and normal.          Skin: Intact without suspicious lesions or rashes. Lymph: No cervical, axillary lymphadenopathy present. Psych: Mood and affect are normal. 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 Warm compresses topically or  Zostrix cream to the area of chest wall discomfort is recommended.  Stretching exercises, yoga would be recommended to prevent the hip/groin pain. She may wish to investigate  a new support mattress.

## 2010-12-31 ENCOUNTER — Ambulatory Visit (INDEPENDENT_AMBULATORY_CARE_PROVIDER_SITE_OTHER): Payer: BC Managed Care – PPO | Admitting: Internal Medicine

## 2010-12-31 ENCOUNTER — Encounter: Payer: Self-pay | Admitting: Internal Medicine

## 2010-12-31 VITALS — BP 110/78 | HR 65 | Temp 98.4°F | Wt 125.0 lb

## 2010-12-31 DIAGNOSIS — M419 Scoliosis, unspecified: Secondary | ICD-10-CM

## 2010-12-31 DIAGNOSIS — IMO0002 Reserved for concepts with insufficient information to code with codable children: Secondary | ICD-10-CM

## 2010-12-31 DIAGNOSIS — M5414 Radiculopathy, thoracic region: Secondary | ICD-10-CM

## 2010-12-31 DIAGNOSIS — M4120 Other idiopathic scoliosis, site unspecified: Secondary | ICD-10-CM

## 2010-12-31 MED ORDER — HYDROCODONE-ACETAMINOPHEN 7.5-500 MG PO TABS: 1.0000 | ORAL_TABLET | ORAL | Status: AC | PRN

## 2010-12-31 MED ORDER — CYCLOBENZAPRINE HCL 5 MG PO TABS: 5.0000 mg | ORAL_TABLET | ORAL | Status: AC

## 2010-12-31 NOTE — Patient Instructions (Signed)
MRI of the thoracic spine if the plain films are negative for compression fracture. Referral to neurosurgeon may be necessary.

## 2010-12-31 NOTE — Progress Notes (Signed)
Subjective:    Patient ID: Megan Downs, female    DOB: 1951-03-13, 60 y.o.   MRN: [id number]  HPI ABDOMINAL PAIN: Location: LUQ/ L flank  Onset: 6/12   Radiation: to back  Severity: up to 10 Quality: sharp, burning  Duration: resolves when she stops moving  Better with: ambulating  Worse with: waist flexion & twisting  Symptoms Nausea/Vomiting: no  Diarrhea: no  Constipation: no  Melena/BRBPR: no  Hematemesis: no  Anorexia: no  Fever/Chills: no  Dysuria: no  Rash: no  Wt loss: yes, with increased work  NSAIDs/ASA: no   Past Surgeries: Endoscopy 2011 : negative; Colonoscopy 2010: negative; Dr Reece Agar.  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  She believes that her bone density studies have not exhibited significant osteoporosis. A bone density study of the spine would not be reliable because of the scoliosis.       Review of Systems she has had loss of height of approximately 2 inches within the last 24 months.     Objective:   Physical Exam Gen.: Healthy and well-nourished in appearance. Alert, appropriate and cooperative throughout exam. She appears uncomfortable related to the acute pain Lungs: Normal respiratory effort; chest expands symmetrically. Lungs are clear to auscultation without rales, wheezes, or increased work of breathing. Heart: Normal rate and rhythm. Normal S1 and S2. No gallop, click, or rub. Grade 1 systolic murmur murmur. Abdomen: Bowel sounds normal; abdomen soft and nontender. No masses, organomegaly or hernias noted.                                                                                    Musculoskeletal/extremities: Profound scoliosis noted of  the thoracic . No clubbing, cyanosis, edema, or deformity noted. Range of motion  normal .Tone & strength  normal.Joints normal. Nail health  Good. She is able to lie flat and sit up without help. She has negative straight  leg raising to 90. Vascular: Carotid, radial artery, dorsalis pedis and  posterior tibial pulses are full and equal. No bruits present. Neurologic: Alert and oriented x3. Deep tendon reflexes symmetrical and normal. Gait including heel and toe walking is normal.         Skin: Intact without suspicious lesions or rashes. Lymph: No cervical, axillary lymphadenopathy present. Psych: Mood and affect are normal. 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  #2 scoliosis, profound  #3 loss of height; rule out compression fracture of  thoracic spine  Plan: #1 pain medication and muscle relaxant  #2 Plain films & probably MRI thoracic spine; referral to a neurosurgeon may be necessary.

## 2011-01-01 ENCOUNTER — Ambulatory Visit (INDEPENDENT_AMBULATORY_CARE_PROVIDER_SITE_OTHER)
Admission: RE | Admit: 2011-01-01 | Discharge: 2011-01-01 | Disposition: A | Discharge status: Home / Self Care | Payer: BC Managed Care – PPO | Source: Ambulatory Visit | Attending: Internal Medicine | Admitting: Internal Medicine

## 2011-01-01 DIAGNOSIS — M419 Scoliosis, unspecified: Secondary | ICD-10-CM

## 2011-01-01 DIAGNOSIS — M4120 Other idiopathic scoliosis, site unspecified: Secondary | ICD-10-CM

## 2011-01-02 ENCOUNTER — Telehealth: Payer: Self-pay

## 2011-01-02 NOTE — Telephone Encounter (Signed)
Patient called requesting results of recent radiology exam(please call on cell [phone]), Dr.Hopper please advise

## 2011-01-03 ENCOUNTER — Other Ambulatory Visit: Payer: Self-pay | Admitting: Internal Medicine

## 2011-01-03 DIAGNOSIS — M5414 Radiculopathy, thoracic region: Secondary | ICD-10-CM

## 2011-01-03 NOTE — Telephone Encounter (Signed)
Patient was informed of radiology results and ok with MRI being set up, copy of report to be mailed

## 2011-01-06 ENCOUNTER — Other Ambulatory Visit (INDEPENDENT_AMBULATORY_CARE_PROVIDER_SITE_OTHER): Payer: BC Managed Care – PPO

## 2011-01-06 DIAGNOSIS — Z01818 Encounter for other preprocedural examination: Secondary | ICD-10-CM

## 2011-01-06 LAB — BASIC METABOLIC PANEL
Chloride: 107 mEq/L (ref 96–112)
Potassium: 4.2 mEq/L (ref 3.5–5.1)

## 2011-01-06 NOTE — Progress Notes (Signed)
Labs only

## 2011-01-07 ENCOUNTER — Ambulatory Visit
Admission: RE | Admit: 2011-01-07 | Discharge: 2011-01-07 | Disposition: A | Discharge status: Home / Self Care | Payer: BC Managed Care – PPO | Source: Ambulatory Visit | Attending: Internal Medicine | Admitting: Internal Medicine

## 2011-01-07 DIAGNOSIS — M5414 Radiculopathy, thoracic region: Secondary | ICD-10-CM

## 2011-01-07 MED ORDER — GADOBENATE DIMEGLUMINE 529 MG/ML IV SOLN
10.0000 mL | Freq: Once | INTRAVENOUS | Status: AC | PRN
  Administered 2011-01-07: 10 mL via INTRAVENOUS

## 2011-01-08 ENCOUNTER — Telehealth: Payer: Self-pay | Admitting: *Deleted

## 2011-01-08 NOTE — Telephone Encounter (Signed)
Pt would like to know her MRI results, please advise.

## 2011-01-09 ENCOUNTER — Telehealth: Payer: Self-pay | Admitting: *Deleted

## 2011-01-09 NOTE — Telephone Encounter (Signed)
Message left for patient to return my call.  

## 2011-01-09 NOTE — Telephone Encounter (Signed)
Spoke with patient, patient would like to consider referral .

## 2011-01-09 NOTE — Telephone Encounter (Signed)
Message copied by Leanne Lovely on Thu Jan 09, 2011  4:03 PM ------      Message from: Pecola Lawless      Created: Thu Jan 09, 2011  3:23 PM       Findings on MRI suggest possible nerve root impingement/compression at T10 to T11-12. The next day it would be neurosurgical consultation for medical correlation if symptoms are  persisting.

## 2011-01-09 NOTE — Telephone Encounter (Signed)
See phone note

## 2011-01-10 ENCOUNTER — Other Ambulatory Visit (INDEPENDENT_AMBULATORY_CARE_PROVIDER_SITE_OTHER): Payer: BC Managed Care – PPO

## 2011-01-10 ENCOUNTER — Other Ambulatory Visit: Payer: Self-pay | Admitting: Internal Medicine

## 2011-01-10 DIAGNOSIS — R7309 Other abnormal glucose: Secondary | ICD-10-CM

## 2011-01-10 DIAGNOSIS — M5414 Radiculopathy, thoracic region: Secondary | ICD-10-CM

## 2011-01-10 DIAGNOSIS — M419 Scoliosis, unspecified: Secondary | ICD-10-CM

## 2011-01-10 LAB — HEMOGLOBIN A1C: Hgb A1c MFr Bld: 5.6 % (ref 4.6–6.5)

## 2011-01-10 NOTE — Progress Notes (Signed)
Labs only

## 2011-01-24 ENCOUNTER — Telehealth: Payer: Self-pay

## 2011-01-24 ENCOUNTER — Other Ambulatory Visit: Payer: Self-pay | Admitting: Internal Medicine

## 2011-01-24 DIAGNOSIS — M5414 Radiculopathy, thoracic region: Secondary | ICD-10-CM

## 2011-01-24 DIAGNOSIS — M419 Scoliosis, unspecified: Secondary | ICD-10-CM

## 2011-01-24 NOTE — Telephone Encounter (Signed)
Message left on triage voicemail: patient would like referral to surgeon

## 2011-01-30 NOTE — Telephone Encounter (Signed)
Order placed on 17th by Dr.Hopper

## 2011-02-03 ENCOUNTER — Encounter: Payer: Self-pay | Admitting: Internal Medicine

## 2011-02-26 ENCOUNTER — Other Ambulatory Visit: Payer: Self-pay | Admitting: Internal Medicine

## 2011-02-26 DIAGNOSIS — Z0000 Encounter for general adult medical examination without abnormal findings: Secondary | ICD-10-CM

## 2011-02-27 ENCOUNTER — Other Ambulatory Visit (INDEPENDENT_AMBULATORY_CARE_PROVIDER_SITE_OTHER): Payer: BC Managed Care – PPO

## 2011-02-27 DIAGNOSIS — Z0000 Encounter for general adult medical examination without abnormal findings: Secondary | ICD-10-CM

## 2011-02-27 LAB — BASIC METABOLIC PANEL
CO2: 29 mEq/L (ref 19–32)
Calcium: 9.1 mg/dL (ref 8.4–10.5)
Chloride: 102 mEq/L (ref 96–112)
Creatinine, Ser: 0.8 mg/dL (ref 0.4–1.2)
Glucose, Bld: 126 mg/dL — ABNORMAL HIGH (ref 70–99)

## 2011-02-27 LAB — HEPATIC FUNCTION PANEL
ALT: 23 U/L (ref 0–35)
AST: 21 U/L (ref 0–37)
Bilirubin, Direct: 0 mg/dL (ref 0.0–0.3)
Total Bilirubin: 0.6 mg/dL (ref 0.3–1.2)

## 2011-02-27 LAB — CBC WITH DIFFERENTIAL/PLATELET
Basophils Relative: 0.8 % (ref 0.0–3.0)
Eosinophils Absolute: 0.1 10*3/uL (ref 0.0–0.7)
Eosinophils Relative: 1.4 % (ref 0.0–5.0)
HCT: 44.1 % (ref 36.0–46.0)
Lymphs Abs: 2.9 10*3/uL (ref 0.7–4.0)
MCHC: 33.4 g/dL (ref 30.0–36.0)
MCV: 92.8 fl (ref 78.0–100.0)
Monocytes Absolute: 0.6 10*3/uL (ref 0.1–1.0)
Neutrophils Relative %: 37.9 % — ABNORMAL LOW (ref 43.0–77.0)
RBC: 4.75 Mil/uL (ref 3.87–5.11)

## 2011-02-27 LAB — LDL CHOLESTEROL, DIRECT: Direct LDL: 159.8 mg/dL

## 2011-02-27 LAB — LIPID PANEL: Total CHOL/HDL Ratio: 5

## 2011-02-27 NOTE — Progress Notes (Signed)
Labs only

## 2011-03-05 ENCOUNTER — Encounter: Payer: Self-pay | Admitting: Internal Medicine

## 2011-03-06 ENCOUNTER — Other Ambulatory Visit: Payer: Self-pay | Admitting: Internal Medicine

## 2011-03-06 ENCOUNTER — Encounter: Payer: Self-pay | Admitting: Internal Medicine

## 2011-03-06 ENCOUNTER — Ambulatory Visit (INDEPENDENT_AMBULATORY_CARE_PROVIDER_SITE_OTHER): Payer: BC Managed Care – PPO | Admitting: Internal Medicine

## 2011-03-06 DIAGNOSIS — Z23 Encounter for immunization: Secondary | ICD-10-CM

## 2011-03-06 DIAGNOSIS — E782 Mixed hyperlipidemia: Secondary | ICD-10-CM

## 2011-03-06 DIAGNOSIS — M899 Disorder of bone, unspecified: Secondary | ICD-10-CM

## 2011-03-06 DIAGNOSIS — M949 Disorder of cartilage, unspecified: Secondary | ICD-10-CM

## 2011-03-06 DIAGNOSIS — I10 Essential (primary) hypertension: Secondary | ICD-10-CM

## 2011-03-06 DIAGNOSIS — Z0000 Encounter for general adult medical examination without abnormal findings: Secondary | ICD-10-CM

## 2011-03-06 MED ORDER — METOPROLOL SUCCINATE ER 25 MG PO TB24: 25.0000 mg | ORAL_TABLET | Freq: Every day | ORAL | Status: DC

## 2011-03-06 NOTE — Patient Instructions (Signed)
Preventive Health Care: Exercise  30-45  minutes a day, 3-4 days a week. Walking is especially valuable in preventing Osteoporosis. Eat a low-fat diet with lots of fruits and vegetables, up to 7-9 servings per day.Consume less than 30 grams of sugar per day from foods & drinks with High Fructose Corn Syrup as # 1,2,3 or #4 on label.  Please review Dr Gildardo Griffes book Eat, Drink & Be Healthy for dietary cholesterol information. Normal T scores on a bone density exam (BMD)  are +1 to -1. Osteopenia would be -1.1 to -2.4. Osteoporosis is defined by a  T score worse than -2.4. Treatment should be considered  with  T scores worse than -1.5, particularly if there is  family history of low bone density or personal  past history of atypical fractue.BMD should be monitored every 25 months. Recommended lifestyle interventions to prevent  Osteoporosis include calcium 600 mg twice a day  & vitamin D3 supplementation to keep vitamin  D  level @ least 40-60. The usual vitamin D3 dose is 1000 IU daily; but individual dose is determined by annual vitamin D level monitor. [employer]

## 2011-03-06 NOTE — Progress Notes (Signed)
Subjective:    Patient ID: Megan Downs, female    DOB: 1951/03/09, 60 y.o.   MRN: [id number]  HPI  Megan Downs  is here for a physical;acute issues relate to ongoing back issues.     Review of Systems Patient reports no vision/ hearing  changes, adenopathy,fever, weight change,  persistant / recurrent hoarseness , swallowing issues, chest pain,palpitations,edema, hemoptysis, dyspnea( rest/ exertional/paroxysmal nocturnal), gastrointestinal bleeding(melena, rectal bleeding), abdominal pain, significant heartburn,  bowel changes,GU symptoms(dysuria, hematuria,pyuria, incontinence) ), Gyn symptoms(abnormal  bleeding , pain),  syncope, focal weakness, memory loss,numbness & tingling, skin/hair /nail changes,abnormal bruising or bleeding, anxiety,or depression.      Objective:   Physical Exam Gen.: Healthy and well-nourished in appearance. Alert, appropriate and cooperative throughout exam. Head: Normocephalic without obvious abnormalities Eyes: No corneal or conjunctival inflammation noted. Pupils equal round reactive to light and accommodation. Fundal exam is benign without hemorrhages, exudate, papilledema. Extraocular motion intact. Vision grossly normal. Ears: External  ear exam reveals no significant lesions or deformities. Canals clear .TMs normal. Hearing is grossly normal bilaterally. Nose: External nasal exam reveals no deformity or inflammation. Nasal mucosa are pink and moist. No lesions or exudates noted. Septum  Minimally dislocated  Mouth: Oral mucosa and oropharynx reveal no lesions or exudates. Teeth in good repair. Neck: No deformities, masses, or tenderness noted. Range of motion & . Thyroid normal. Lungs: Normal respiratory effort; chest expands symmetrically. Lungs are clear to auscultation without rales, wheezes, or increased work of breathing. Heart: Normal rate and rhythm. Normal S1 ; accentuated S2. No gallop, click, or rub. Basilar Grade 1/2- 1/ 6 systolic murmur  murmur. Abdomen: Bowel sounds normal; abdomen soft and nontender. No masses, organomegaly or hernias noted. Genitalia: as per  Judie Bonus, NP,Gyn   .                                                                                   Musculoskeletal/extremities: Significant  scoliosis noted of  the thoracic or lumbar spine. No clubbing, cyanosis, edema, or deformity noted. Range of motion  Normal; SLR > 125 degrees .Tone & strength  normal.Joints normal. Nail health  good. Vascular: Carotid, radial artery, dorsalis pedis and  posterior tibial pulses are full and equal. No bruits present. Neurologic: Alert and oriented x3. Deep tendon reflexes symmetrical and normal.         Skin: Intact without suspicious lesions or rashes. Lymph: No cervical, axillary  lymphadenopathy present. Psych: Mood and affect are normal. 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 MRI shows moderately severe lower thoracic and upper lumbar scoliosis and mild thoracic spinal stenosis without apparent myelopathy. Neurosurgical consultation with Dr. Danielle Dess is pending. Plan: see Orders

## 2011-05-28 ENCOUNTER — Other Ambulatory Visit: Payer: Self-pay | Admitting: Internal Medicine

## 2011-07-08 ENCOUNTER — Other Ambulatory Visit: Payer: Self-pay | Admitting: Internal Medicine

## 2011-08-05 ENCOUNTER — Ambulatory Visit (INDEPENDENT_AMBULATORY_CARE_PROVIDER_SITE_OTHER): Payer: BC Managed Care – PPO | Admitting: Internal Medicine

## 2011-08-05 ENCOUNTER — Encounter: Payer: Self-pay | Admitting: Internal Medicine

## 2011-08-05 VITALS — BP 120/80 | HR 67 | Temp 98.5°F | Wt 131.0 lb

## 2011-08-05 DIAGNOSIS — J40 Bronchitis, not specified as acute or chronic: Secondary | ICD-10-CM

## 2011-08-05 MED ORDER — ALBUTEROL SULFATE HFA 108 (90 BASE) MCG/ACT IN AERS: 2.0000 | INHALATION_SPRAY | Freq: Four times a day (QID) | RESPIRATORY_TRACT | Status: DC | PRN

## 2011-08-05 MED ORDER — DOXYCYCLINE HYCLATE 100 MG PO TABS: 100.0000 mg | ORAL_TABLET | Freq: Two times a day (BID) | ORAL | Status: AC

## 2011-08-05 MED ORDER — HYDROCOD POLST-CHLORPHEN POLST 10-8 MG/5ML PO LQCR: 5.0000 mL | Freq: Two times a day (BID) | ORAL | Status: DC | PRN

## 2011-08-05 NOTE — Progress Notes (Signed)
  Subjective:    Patient ID: Megan Downs, female    DOB: 21-Mar-1951, 61 y.o.   MRN: [id number]  HPI Acute visit Symptoms started about a week ago with cough, sore throat, chest congestion. She also has some wheezing. Has been taking Mucinex without much help. She is a nonsmoker and has no history of asthma but in the past she had frequent bronchitis  Past Medical History  Diagnosis Date  . Hyperlipidemia   . MVP (mitral valve prolapse)     Dr Amil Amen ,Cardiology  stated no SBE prophylaxis  . Medullary sponge kidney   . 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 Allergy       Review of Systems No fever chills Very mild sputum production. She does have ear pain bilaterally but no sinus pain. She does have some postnasal dripping No nausea diarrhea ; vomited twice she thinks due to severe cough.    Objective:   Physical Exam  alert, oriented x3. HEENT, tympanic membranes slightly bulging but not red, no discharge. Nose is not congested. Face is symmetric, nontender to palpation. Lungs: No increased work of breathing, mild large airway congestion with cough, no crackles or actual wheezing at this time. Frequent cough noted Cardiovascular regular rate and rhythm without murmur Neurological: Alert oriented x3, speech gait and motor are intact    Assessment & Plan:  Bronchitis: Symptoms most likely from bronchitis, she also reports wheezing--->  ? bronchospasm. See instructions. Asked her to call me if she's not getting better soon.

## 2011-08-05 NOTE — Patient Instructions (Signed)
Rest, fluids , tylenol For cough, take robitussin DM twice a day as needed  If the cough continue , use tussionex (will cause drowsiness) Also if wheezing or severe cough, use ventolin Take the antibiotic as prescribed ----> doxycycline Call if no better in few days Call anytime if the symptoms are severe

## 2011-08-15 IMAGING — CR DG THORACIC SPINE 2V
3 series · 3 of 3 positions shown · non-contrast
Comparison: PA and lateral chest 12/13/2008.

CLINICAL DATA: Back pain.  Scoliosis.

THORACIC SPINE - 2 VIEW

[view not recorded (1 of 3)]
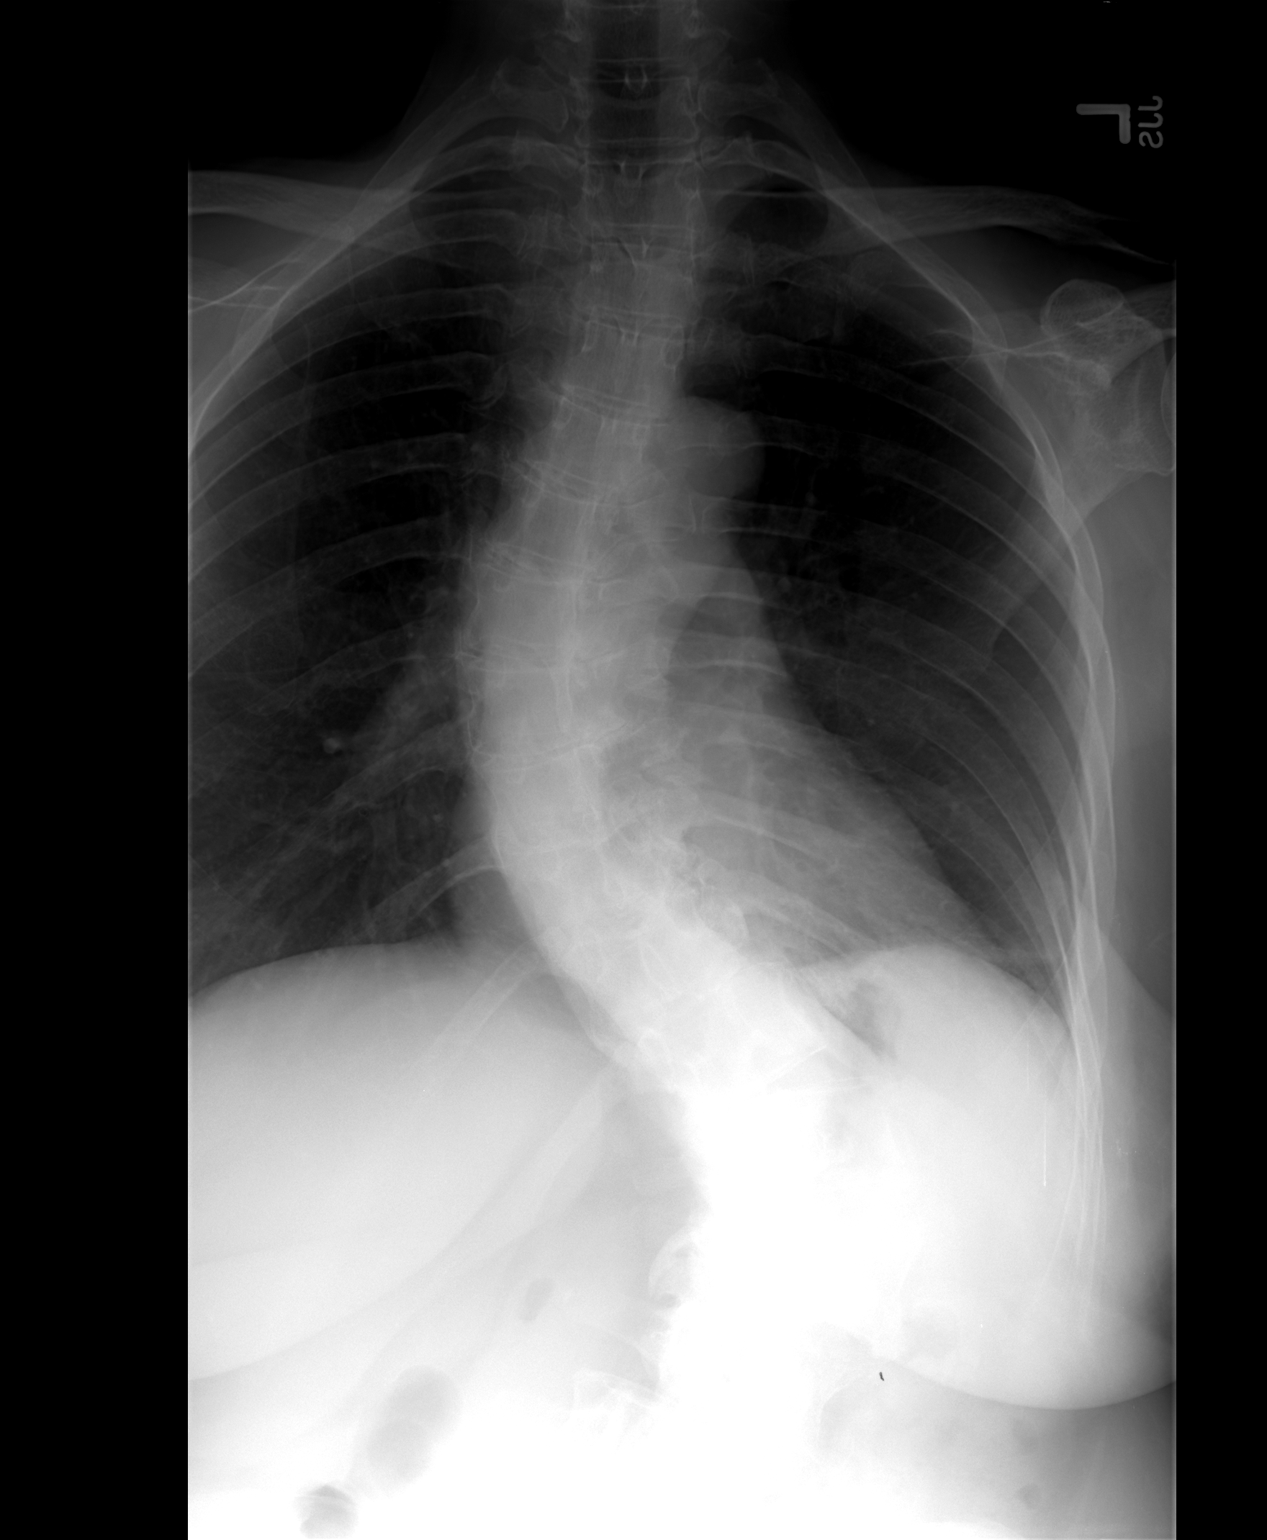

[view not recorded (2 of 3)]
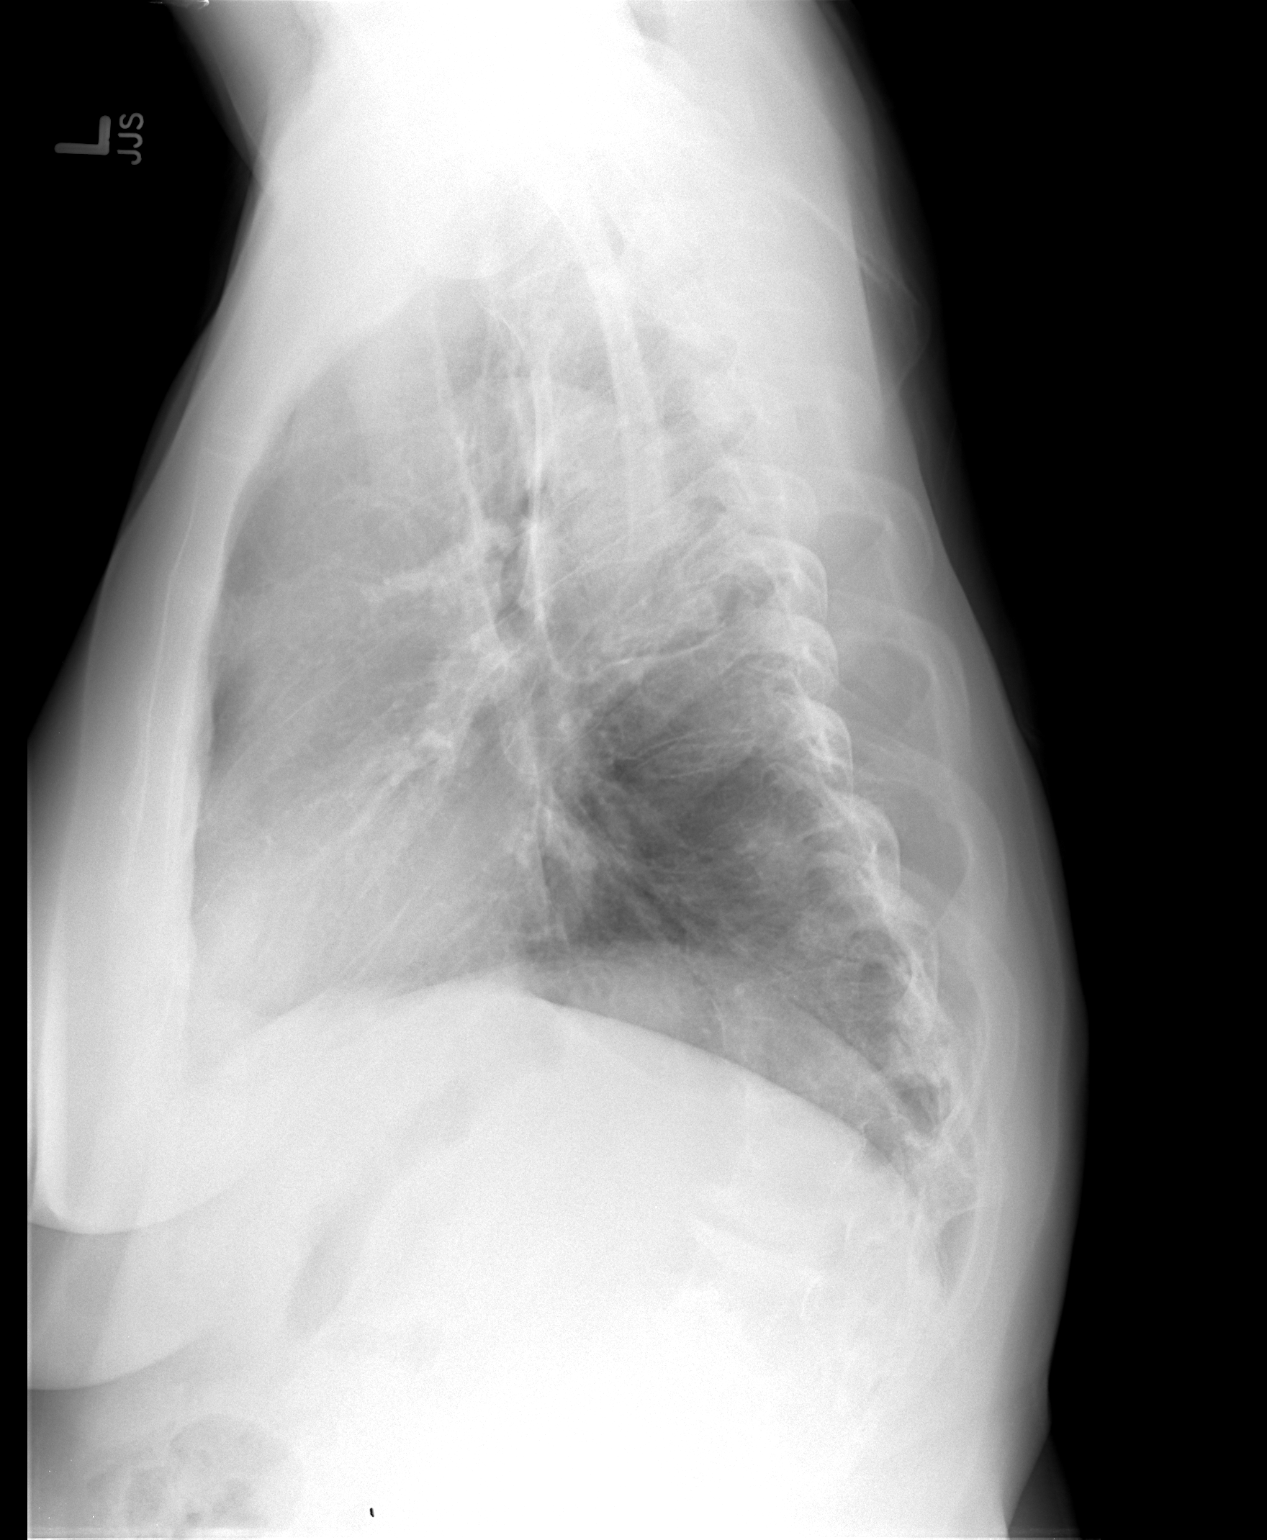

[view not recorded (3 of 3)]
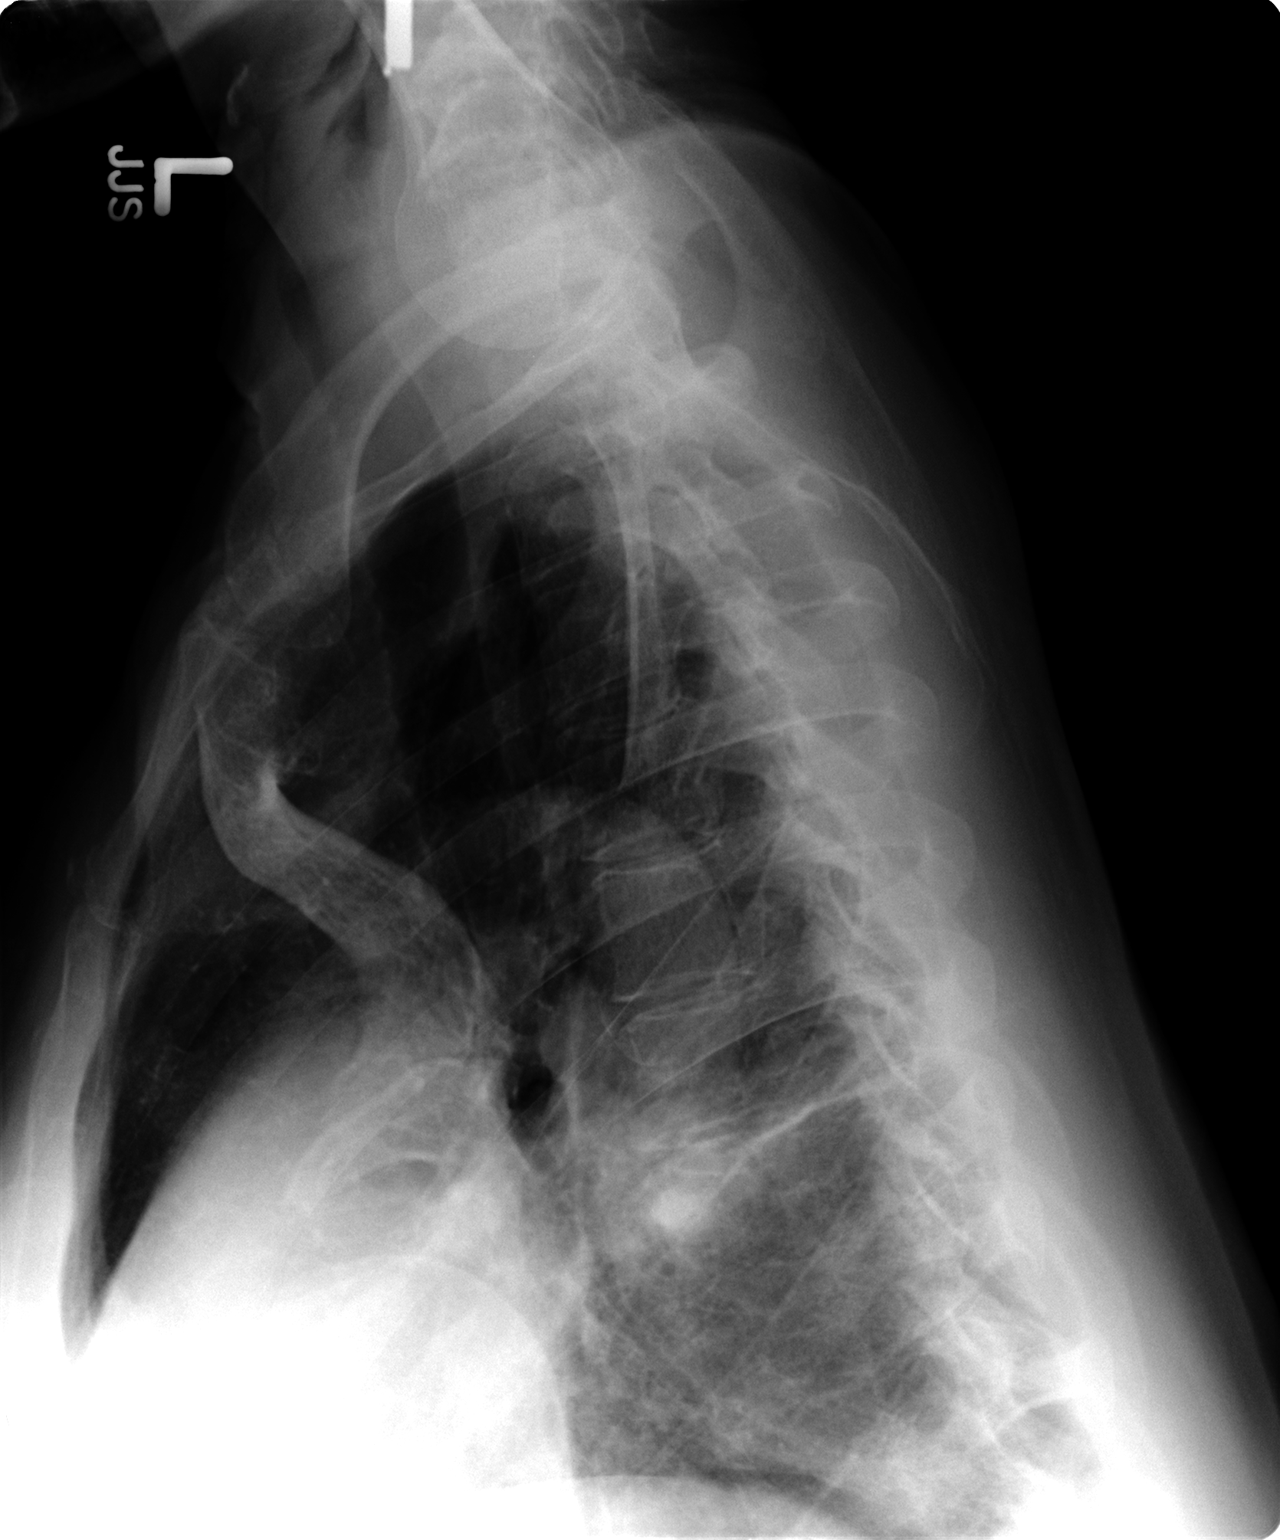

[3 of 3 positions shown; findings below may reference images not displayed]

FINDINGS: The patient has severe S-shaped thoracolumbar scoliosis.
No segmentation anomaly is identified.  Curvature is convex to the
right with the apex at T7-8 and to the left with the apex at L1-2.
No fracture is identified.
IMPRESSION: Severe scoliosis.  No acute finding.

## 2011-08-19 ENCOUNTER — Telehealth: Payer: Self-pay | Admitting: Internal Medicine

## 2011-08-19 NOTE — Telephone Encounter (Signed)
Patient with pending appointment for tomorrow with Dr.Tabori. Will forward to Dr.Hopper Lorain Childes)

## 2011-08-19 NOTE — Telephone Encounter (Signed)
Appt. made for 1130 w/ Dr. Beverely Low for on 08/20/11.-SS  [facility] Triage Call Report Triage Record Num: [id number] Operator: Graciella Belton Raynor Patient Name: Megan Downs Call Date & Time: 08/19/2011 1:44:05PM Patient Phone: [phone] PCP: Patient Gender: Female PCP Fax : Patient DOB: 11-28-1950 Practice Name: [facility] Reason for Call: Caller: Nalini/Patient; PCP: Marga Melnick; CB#: [phone]; ; ; Call regarding Cough/Congestion; Finished Doxycycline on 08/16/11 for bronchitis. Initially better and 08/18/11 started back with congested cough; drainage down back of the throat; afebrile; intense malaise; reports feeling like she's working harder to breathe. All emergent sxs per Cough protocol w/ exception to 'Evaluated by provider and no improvement sxs after following treatment plan.'. Pt. did not use Albuterol inhaler - only abx and cough syrup. Appt. made for 1130 w/ Dr. Beverely Low for on 08/20/11. Pt. encouraged to use inhaler to assist w/ sxs - call back parameters given. Protocol(s) Used: Cough - Adult Recommended Outcome per Protocol: See Provider within 72 Hours Reason for Outcome: Evaluated by provider AND no improvement in symptoms after following treatment plan for the time specified by provider Care Advice: ~ Use a cool mist humidifier to moisten air. Be sure to clean according to manufacturer's instructions. ~ Continue to follow treatment plan, including medications, until evaluated by provider

## 2011-08-20 ENCOUNTER — Ambulatory Visit (INDEPENDENT_AMBULATORY_CARE_PROVIDER_SITE_OTHER): Payer: BC Managed Care – PPO | Admitting: Family Medicine

## 2011-08-20 ENCOUNTER — Encounter: Payer: Self-pay | Admitting: Family Medicine

## 2011-08-20 VITALS — BP 118/75 | HR 57 | Temp 98.6°F | Ht 60.0 in | Wt 129.2 lb

## 2011-08-20 DIAGNOSIS — J309 Allergic rhinitis, unspecified: Secondary | ICD-10-CM

## 2011-08-20 DIAGNOSIS — J410 Simple chronic bronchitis: Secondary | ICD-10-CM | POA: Insufficient documentation

## 2011-08-20 DIAGNOSIS — J45909 Unspecified asthma, uncomplicated: Secondary | ICD-10-CM

## 2011-08-20 MED ORDER — AZITHROMYCIN 250 MG PO TABS: ORAL_TABLET | ORAL | Status: AC

## 2011-08-20 MED ORDER — BENZONATATE 200 MG PO CAPS: 200.0000 mg | ORAL_CAPSULE | Freq: Three times a day (TID) | ORAL | Status: DC | PRN

## 2011-08-20 NOTE — Assessment & Plan Note (Signed)
Pt's sxs poorly controlled.  Start OTC antihistamine and nasal steroid.  Reviewed supportive care and red flags that should prompt return. Pt expressed understanding and is in agreement w/ plan.

## 2011-08-20 NOTE — Assessment & Plan Note (Signed)
New.  Pt reports 'it always goes so deep in my chest' and is very persistent about abx.  Reviewed this is likely due to untreated seasonal allergies.  Will start Zpack.  Reviewed supportive care and red flags that should prompt return.  Pt expressed understanding and is in agreement w/ plan.

## 2011-08-20 NOTE — Patient Instructions (Signed)
This is likely an allergy/bronchitis combo Start the Azithromycin as directed Start the Mucinex to thin your congestion Start Zyrtec daily for the allergy component Drink plenty of fluids REST! Hang in there!!!

## 2011-08-20 NOTE — Progress Notes (Signed)
  Subjective:    Patient ID: Megan Downs, female    DOB: [birth year], 61 y.o.   MRN: [id number]  HPI Cough- seen for similar sxs on 2/26 and started on Doxy, cough syrup, and inhaler.  Finished abx, only took cough syrup x2 due to side effects.  By late last week 'felt so good'.  sxs returned on Monday w/ cough, sore throat, fatigue.  Denies facial pain/pressure.  Cough worsens as day goes on.  No fevers.  Mild wheezing.  Mild ear pain.  No known sick contacts.  + seasonal allergies- not currently on meds.   Review of Systems For ROS see HPI     Objective:   Physical Exam  Vitals reviewed. Constitutional: She appears well-developed and well-nourished. No distress.  HENT:  Head: Normocephalic and atraumatic.  Right Ear: Tympanic membrane normal.  Left Ear: Tympanic membrane normal.  Nose: Mucosal edema and rhinorrhea present. Right sinus exhibits no maxillary sinus tenderness and no frontal sinus tenderness. Left sinus exhibits no maxillary sinus tenderness and no frontal sinus tenderness.  Mouth/Throat: Mucous membranes are normal. Posterior oropharyngeal erythema (w/ PND) present.  Eyes: Conjunctivae and EOM are normal. Pupils are equal, round, and reactive to light.  Neck: Normal range of motion. Neck supple.  Cardiovascular: Normal rate, regular rhythm and normal heart sounds.   Pulmonary/Chest: Effort normal and breath sounds normal. No respiratory distress. She has no wheezes. She has no rales.       + hacking cough  Lymphadenopathy:    She has no cervical adenopathy.          Assessment & Plan:

## 2011-09-03 ENCOUNTER — Ambulatory Visit (INDEPENDENT_AMBULATORY_CARE_PROVIDER_SITE_OTHER)
Admission: RE | Admit: 2011-09-03 | Discharge: 2011-09-03 | Disposition: A | Discharge status: Home / Self Care | Payer: BC Managed Care – PPO | Source: Ambulatory Visit | Attending: Internal Medicine | Admitting: Internal Medicine

## 2011-09-03 ENCOUNTER — Ambulatory Visit (INDEPENDENT_AMBULATORY_CARE_PROVIDER_SITE_OTHER): Payer: BC Managed Care – PPO | Admitting: Internal Medicine

## 2011-09-03 ENCOUNTER — Encounter: Payer: Self-pay | Admitting: Internal Medicine

## 2011-09-03 VITALS — BP 126/80 | HR 72 | Temp 98.6°F | Wt 129.4 lb

## 2011-09-03 DIAGNOSIS — J209 Acute bronchitis, unspecified: Secondary | ICD-10-CM

## 2011-09-03 DIAGNOSIS — J310 Chronic rhinitis: Secondary | ICD-10-CM

## 2011-09-03 DIAGNOSIS — R5383 Other fatigue: Secondary | ICD-10-CM

## 2011-09-03 LAB — CBC WITH DIFFERENTIAL/PLATELET
Basophils Absolute: 0.1 10*3/uL (ref 0.0–0.1)
Basophils Relative: 1.1 % (ref 0.0–3.0)
Eosinophils Absolute: 0.1 10*3/uL (ref 0.0–0.7)
Hemoglobin: 14.6 g/dL (ref 12.0–15.0)
Lymphocytes Relative: 38.9 % (ref 12.0–46.0)
MCHC: 33.9 g/dL (ref 30.0–36.0)
Monocytes Relative: 12.4 % — ABNORMAL HIGH (ref 3.0–12.0)
Neutro Abs: 2.5 10*3/uL (ref 1.4–7.7)
Neutrophils Relative %: 46 % (ref 43.0–77.0)
RBC: 4.7 Mil/uL (ref 3.87–5.11)
RDW: 12.6 % (ref 11.5–14.6)

## 2011-09-03 MED ORDER — PREDNISONE 20 MG PO TABS: 20.0000 mg | ORAL_TABLET | Freq: Two times a day (BID) | ORAL | Status: AC

## 2011-09-03 MED ORDER — BUDESONIDE-FORMOTEROL FUMARATE 160-4.5 MCG/ACT IN AERO: 2.0000 | INHALATION_SPRAY | Freq: Two times a day (BID) | RESPIRATORY_TRACT | Status: DC

## 2011-09-03 MED ORDER — FLUTICASONE PROPIONATE 50 MCG/ACT NA SUSP: 1.0000 | Freq: Two times a day (BID) | NASAL | Status: DC | PRN

## 2011-09-03 NOTE — Progress Notes (Signed)
  Subjective:    Patient ID: Megan Downs, female    DOB: [birth year], 61 y.o.   MRN: [id number]  HPI   Respiratory tract infection Onset/symptoms:late January 2013 in [place] as P[place]rainage Exposures (illness/environmental/extrinsic):no Progression of symptoms:to chest congestion Treatments/response:OV X 2 with 2 courses of antibiotics (Doxycycline & Zpack + MDI, cough syrup) Present symptoms: Fever/chills/sweats:no Frontal headache:no Facial pain:no Nasal purulence:no Sore throat:some Dental pain:no Lymphadenopathy:no Wheezing/shortness of breath:previously, MDI helped Cough/sputum/hemoptysis:dry Pleuritic pain:no Associated extrinsic/allergic symptoms:itchy eyes/ sneezing:no Past medical history: Seasonal allergies: yes/asthma:EIB Smoking history:1977             Review of Systems the postnasal drainage has persisted. She denies any reflux symptoms or dysphagia. She is not on ACE inhibitor.  She describes significant persistent fatigue. She makes the comment "I could go back to bed now"     Objective:   Physical Exam  General appearance:good health ;well nourished; no acute distress or increased work of breathing is present.  No  lymphadenopathy about the head, neck, or axilla noted.   Eyes: No conjunctival inflammation or lid edema is present.  Ears:  External ear exam shows no significant lesions or deformities.  Otoscopic examination reveals clear canals, tympanic membranes are intact bilaterally without bulging, retraction, inflammation or discharge.  Nose:  External nasal examination shows no deformity or inflammation. Nasal mucosa are pink and moist without lesions or exudates. Mild septal dislocation. Minimal R nare obstruction to airflow.   Oral exam: Dental hygiene is good; lips and gums are healthy appearing.There is no oropharyngeal erythema or exudate noted.   Neck:  No deformities, masses, or tenderness noted.   Heart:  Normal rate and regular  rhythm. S1 and S2 normal without gallop,  click, rub or other extra sounds. Grade 1/6 raspy LSB systolic murmur   Lungs:Chest clear to auscultation; no wheezes, rhonchi,rales ,or rubs present.No increased work of breathing. She has a brassy, nonproductive cough. Breath sounds are slightly decreased in the left lower lobe without adventitious sounds.  Chest: She has striking scoliosis  Extremities:  No cyanosis, edema, or clubbing  noted    Skin: Warm & dry           Assessment & Plan:  #1 acute bronchitis w/o bronchospasm. Status post 2 courses of antibiotics. Past medical history of exercise-induced bronchospasm  #2 persistent rhinitis without purulent secretions Plan: See orders and recommendations

## 2011-09-03 NOTE — Patient Instructions (Addendum)
Plain Mucinex for thick secretions ;force NON dairy fluids . Use a Neti pot daily as needed for sinus congestion. Nasal cleansing in the shower as discussed. Make sure that all residual soap is removed to prevent irritation. Fluticasone 1 spray in each nostril twice a day as needed. Use the "crossover" technique as discussed Order for x-rays entered into  the computer; these will be performed at [facility]. No appointment is necessary. Symbicort  1-2  inhalations every 12 hours; gargle and spit after use

## 2011-09-08 ENCOUNTER — Telehealth: Payer: Self-pay | Admitting: Internal Medicine

## 2011-09-08 NOTE — Telephone Encounter (Signed)
Left message to call office   Notes Recorded by Pecola Lawless, MD on 09/06/2011 at 8:22 AM As you are aware scliosis is a side to side or S-shaped curvature of the spine. This can be associated with increased intra lung pressures with cardiac symptoms, not cough. Your thin body habitus mimics the appearance of emphysema with increased lucency of lung fields . This is called " Pseudo Emphysema".If you do have significant shortness of breath with exercise; Pulmonary Function Tests would be appropriate.No infiltrates present to suggest infectious process such as bronchitis or pneumonia. [employer]

## 2011-09-08 NOTE — Telephone Encounter (Signed)
Patient would like a call back with any results from her xray last week. Please call at [phone]

## 2011-09-09 ENCOUNTER — Telehealth: Payer: Self-pay | Admitting: Internal Medicine

## 2011-09-09 NOTE — Telephone Encounter (Signed)
Spoke with patient, patient ok'd xray results and questioned if she can d/c prednisone. Per Dr.Hopper as he was standing in front of me at the time patient presented question, ok to d/c if symptoms are better. Per patient she will d/c at this time.

## 2011-09-09 NOTE — Telephone Encounter (Signed)
Returning Felecia's call from yesterday regarding her Xray please call at  [phone]

## 2011-10-06 ENCOUNTER — Other Ambulatory Visit: Payer: Self-pay | Admitting: Internal Medicine

## 2012-01-24 ENCOUNTER — Other Ambulatory Visit: Payer: Self-pay | Admitting: Internal Medicine

## 2012-02-12 ENCOUNTER — Encounter: Payer: Self-pay | Admitting: Internal Medicine

## 2012-03-08 ENCOUNTER — Ambulatory Visit (INDEPENDENT_AMBULATORY_CARE_PROVIDER_SITE_OTHER): Payer: BC Managed Care – PPO | Admitting: Internal Medicine

## 2012-03-08 ENCOUNTER — Encounter: Payer: Self-pay | Admitting: Internal Medicine

## 2012-03-08 VITALS — BP 108/72 | HR 61 | Temp 98.3°F | Resp 12 | Ht 61.08 in | Wt 133.4 lb

## 2012-03-08 DIAGNOSIS — Z23 Encounter for immunization: Secondary | ICD-10-CM

## 2012-03-08 DIAGNOSIS — Z0000 Encounter for general adult medical examination without abnormal findings: Secondary | ICD-10-CM

## 2012-03-08 DIAGNOSIS — M949 Disorder of cartilage, unspecified: Secondary | ICD-10-CM

## 2012-03-08 DIAGNOSIS — J310 Chronic rhinitis: Secondary | ICD-10-CM

## 2012-03-08 DIAGNOSIS — J45909 Unspecified asthma, uncomplicated: Secondary | ICD-10-CM

## 2012-03-08 DIAGNOSIS — Z136 Encounter for screening for cardiovascular disorders: Secondary | ICD-10-CM

## 2012-03-08 DIAGNOSIS — M899 Disorder of bone, unspecified: Secondary | ICD-10-CM

## 2012-03-08 MED ORDER — BUDESONIDE-FORMOTEROL FUMARATE 160-4.5 MCG/ACT IN AERO: 2.0000 | INHALATION_SPRAY | Freq: Two times a day (BID) | RESPIRATORY_TRACT | Status: DC

## 2012-03-08 MED ORDER — FLUTICASONE PROPIONATE 50 MCG/ACT NA SUSP: 1.0000 | Freq: Two times a day (BID) | NASAL | Status: DC | PRN

## 2012-03-08 MED ORDER — MONTELUKAST SODIUM 10 MG PO TABS: 10.0000 mg | ORAL_TABLET | Freq: Every day | ORAL | Status: DC

## 2012-03-08 NOTE — Patient Instructions (Addendum)
Preventive Health Care: Exercise  30-45  minutes a day, 3-4 days a week. Walking is especially valuable in preventing Osteoporosis. Eat a low-fat diet with lots of fruits and vegetables, up to 7-9 servings per day.  Consume less than 30 grams of sugar per day from foods & drinks with High Fructose Corn Syrup as #1,2,3 or #4 on label. Plain Mucinex for thick secretions ;force NON dairy fluids . Use a Neti pot daily as needed for sinus congestion; going from open side to congested side . Nasal cleansing in the shower as discussed. Make sure that all residual soap is removed to prevent irritation. Fluticasone 1 spray in each nostril twice a day as needed. Use the "crossover" technique as discussed. Plain Allegra 160 daily as needed for itchy eyes & sneezing.  If you activate My Chart; the results can be released to you as soon as they populate from the lab. If you choose not to use this program; the labs have to be reviewed, copied & mailed   causing a delay in getting the results to you.    Please  schedule fasting Labs : BMET,Lipids, hepatic panel, CBC & dif, TSH, vit D level. PLEASE BRING THESE INSTRUCTIONS TO FOLLOW UP  LAB APPOINTMENT.This will guarantee correct labs are drawn, eliminating need for repeat blood sampling ( needle sticks ! ). Diagnoses /Codes: V70.0

## 2012-03-08 NOTE — Progress Notes (Signed)
  Subjective:    Patient ID: Megan Downs, female    DOB: 06/25/1950, 61 y.o.   MRN: [id number]  HPI Megan Downs is here for a physical;acute issues include chronic cough     Review of Systems Her cough is presumed to be extrinsic in nature; is responsive to albuterol which she employs up to 3 times a day. She denies paroxysmal nocturnal dyspnea. She has not been using the maintenance drug, Symbicort.  Her cough is associated with itchy, watery eyes and sneezing. She experiences morning nasal congestion. Claritin & Zyrtec have not been of benefit.She has not taken generic Singulair to date.     Objective:   Physical Exam Gen.:  well-nourished in appearance. Alert, appropriate and cooperative throughout exam. Head: Normocephalic without obvious abnormalities  Eyes: No corneal or conjunctival inflammation noted. Pupils equal round reactive to light and accommodation. Fundal exam is benign without hemorrhages, exudate, papilledema. Extraocular motion intact. Vision grossly normal. Ears: External  ear exam reveals no significant lesions or deformities. Canals clear .TMs normal. Hearing is grossly normal bilaterally. Nose: External nasal exam reveals no deformity or inflammation. Nasal mucosa are pink and moist. No lesions or exudates noted. Septum  Dislocated to R  Mouth: Oral mucosa and oropharynx reveal no lesions or exudates. Teeth in good repair. Neck: No deformities, masses, or tenderness noted. Range of motion & Thyroid normal Lungs: Normal respiratory effort; chest expands symmetrically. Lungs are clear to auscultation without rales, wheezes, or increased work of breathing. Heart: Normal rate and rhythm. Normal S1 and S2. No gallop, click, or rub. Grade 1/6 systolic murmur . Abdomen: Bowel sounds normal; abdomen soft and nontender. No masses, organomegaly or hernias noted. Genitalia: Megan Gutting, NP Musculoskeletal/extremities: Scoliosis noted of  the thoraco lumbar spine. No clubbing,  cyanosis, edema, or deformity noted. Range of motion  normal .Tone & strength  normal.Joints normal. Nail health  good. Vascular: Carotid, radial artery, dorsalis pedis and  posterior tibial pulses are full and equal. No bruits present. Neurologic: Alert and oriented x3. Deep tendon reflexes symmetrical and normal.          Skin: Intact without suspicious lesions or rashes. Lymph: No cervical, axillary lymphadenopathy present. Psych: Mood and affect are normal. Normally interactive                                                                                        Assessment & Plan:  #1 comprehensive physical exam; no acute findings #2 see Problem List with Assessments & Recommendations Plan: see Orders

## 2012-03-09 ENCOUNTER — Other Ambulatory Visit (INDEPENDENT_AMBULATORY_CARE_PROVIDER_SITE_OTHER): Payer: BC Managed Care – PPO

## 2012-03-09 DIAGNOSIS — Z0000 Encounter for general adult medical examination without abnormal findings: Secondary | ICD-10-CM

## 2012-03-09 LAB — LIPID PANEL
Cholesterol: 276 mg/dL — ABNORMAL HIGH (ref 0–200)
Total CHOL/HDL Ratio: 4
Triglycerides: 161 mg/dL — ABNORMAL HIGH (ref 0.0–149.0)

## 2012-03-09 LAB — BASIC METABOLIC PANEL
Chloride: 101 mEq/L (ref 96–112)
Creatinine, Ser: 0.6 mg/dL (ref 0.4–1.2)
GFR: 101.53 mL/min (ref >60.00)
Potassium: 4 mEq/L (ref 3.5–5.1)

## 2012-03-09 LAB — CBC WITH DIFFERENTIAL/PLATELET
Basophils Absolute: 0 10*3/uL (ref 0.0–0.1)
Eosinophils Relative: 1.8 % (ref 0.0–5.0)
HCT: 43.9 % (ref 36.0–46.0)
Lymphocytes Relative: 27.7 % (ref 12.0–46.0)
Monocytes Relative: 12.4 % — ABNORMAL HIGH (ref 3.0–12.0)
Neutrophils Relative %: 57.5 % (ref 43.0–77.0)
Platelets: 243 10*3/uL (ref 150.0–400.0)
WBC: 5.7 10*3/uL (ref 4.5–10.5)

## 2012-03-09 LAB — HEPATIC FUNCTION PANEL
ALT: 30 U/L (ref 0–35)
AST: 23 U/L (ref 0–37)
Albumin: 4.3 g/dL (ref 3.5–5.2)
Total Protein: 6.9 g/dL (ref 6.0–8.3)

## 2012-03-09 LAB — LDL CHOLESTEROL, DIRECT: Direct LDL: 181.9 mg/dL

## 2012-03-09 LAB — TSH: TSH: 2.77 u[IU]/mL (ref 0.35–5.50)

## 2012-03-14 ENCOUNTER — Other Ambulatory Visit: Payer: Self-pay | Admitting: Internal Medicine

## 2012-03-14 DIAGNOSIS — R079 Chest pain, unspecified: Secondary | ICD-10-CM

## 2012-03-14 DIAGNOSIS — E785 Hyperlipidemia, unspecified: Secondary | ICD-10-CM

## 2012-03-16 LAB — VITAMIN D 1,25 DIHYDROXY
Vitamin D 1, 25 (OH)2 Total: 64 pg/mL (ref 18–72)
Vitamin D2 1, 25 (OH)2: <8 pg/mL
Vitamin D3 1, 25 (OH)2: 64 pg/mL

## 2012-03-23 ENCOUNTER — Encounter: Payer: Self-pay | Admitting: Physician Assistant

## 2012-03-23 ENCOUNTER — Ambulatory Visit (INDEPENDENT_AMBULATORY_CARE_PROVIDER_SITE_OTHER): Payer: BC Managed Care – PPO | Admitting: Physician Assistant

## 2012-03-23 DIAGNOSIS — E785 Hyperlipidemia, unspecified: Secondary | ICD-10-CM

## 2012-03-23 DIAGNOSIS — R079 Chest pain, unspecified: Secondary | ICD-10-CM

## 2012-03-23 NOTE — Procedures (Signed)
Megan Downs is a 61 y.o. female referred by PCP for ETT.  She has no hx of CAD.  Does have PMH of "pre-diabetes", HTN, HL (no current Rx).  Remote cigarette use.  None now.  Remote FHx of CAD.  Patient had left arm pain/chest pain several mos ago relieved by ASA.  No symptoms since.  No dyspnea, syncope.  Exam unremarkable.     Exercise Treadmill Test  Pre-Exercise Testing Evaluation Rhythm: normal sinus  Rate: 61   PR:  .15 QRS:  .09  QT:  .41 QTc: .41     Test  Exercise Tolerance Test Ordering MD: Marga Melnick , MD  Interpreting MD: Tereso Newcomer , PA-C  Unique Test No: 1  Treadmill:  1  Indication for ETT: chest pain - rule out ischemia  Contraindication to ETT: no  Stress Modality: exercise - treadmill  Cardiac Imaging Performed: non   Protocol: standard Bruce - maximal  Max BP:  168/89  Max MPHR (bpm):  158 85% MPR (bpm):  134  MPHR obtained (bpm):  136 % MPHR obtained:  87%  Reached 85% MPHR (min:sec):  6:58 Total Exercise Time (min-sec):  9:00  Workload in METS:  10.1 Borg Scale: 15  Reason ETT Terminated:  patient's desire to stop    ST Segment Analysis At Rest: normal ST segments - no evidence of significant ST depression With Exercise: non-specific ST changes  Other Information Arrhythmia:  No Angina during ETT:  absent (0) Quality of ETT:  diagnostic  ETT Interpretation:  normal - no evidence of ischemia by ST analysis  Comments: Good exercise tolerance. No chest pain. Normal BP response to exercise. No ST-T changes to suggest ischemia.   Recommendations: Follow up with Marga Melnick, MD as directed. Tereso Newcomer, PA-C  10:04 AM 03/23/2012

## 2012-04-16 ENCOUNTER — Other Ambulatory Visit: Payer: Self-pay | Admitting: Internal Medicine

## 2012-04-16 NOTE — Telephone Encounter (Signed)
OV 03/08/12 showing as historical med no info. For me to see ( ex. last filled, quantity etc) Need okay.  Plz advise    MW

## 2012-04-16 NOTE — Telephone Encounter (Signed)
#  30 , R X 3

## 2012-05-19 ENCOUNTER — Other Ambulatory Visit: Payer: Self-pay

## 2012-05-19 MED ORDER — METOPROLOL TARTRATE 25 MG PO TABS: ORAL_TABLET | ORAL | Status: DC

## 2012-05-24 ENCOUNTER — Other Ambulatory Visit: Payer: Self-pay | Admitting: *Deleted

## 2012-05-24 ENCOUNTER — Telehealth: Payer: Self-pay | Admitting: *Deleted

## 2012-05-24 MED ORDER — METOPROLOL TARTRATE 25 MG PO TABS: ORAL_TABLET | ORAL | Status: DC

## 2012-05-24 NOTE — Telephone Encounter (Signed)
I called pharmacy [facility] and cancelled pending refills for Metoprolol, patient is now wanting rx filled at [facility] pharmacy.

## 2012-05-24 NOTE — Telephone Encounter (Signed)
Patient called triage line, lm that she needed Rx refills sent to [facility], did not say ahwt needed refilled. Called home number left message to return call to clarify meds needing refills.  # [phone].

## 2012-05-24 NOTE — Telephone Encounter (Signed)
[employer] called, rx for metoprolol sent in

## 2012-08-24 ENCOUNTER — Telehealth: Payer: Self-pay | Admitting: Internal Medicine

## 2012-08-24 NOTE — Telephone Encounter (Signed)
Refill: Duloxetine 60mg  cer. Take 1 capsule by mouth daily. Qty 30. Last fill 07-23-12

## 2012-08-25 MED ORDER — DULOXETINE HCL 60 MG PO CPEP: ORAL_CAPSULE | ORAL | Status: DC

## 2012-09-28 ENCOUNTER — Telehealth: Payer: Self-pay | Admitting: General Practice

## 2012-09-28 NOTE — Telephone Encounter (Signed)
At your desk.

## 2012-09-28 NOTE — Telephone Encounter (Signed)
Left message to call office to advise Pt samples placed up front for pick up.

## 2012-09-28 NOTE — Telephone Encounter (Signed)
Pt called stating that she left her Rx for Cymbalta with her daughter in [place] and will not see her for 10 days to receive. Wants to know if we can get a 1 weeks supply of samples until she sees her daughter. Please advise.

## 2012-09-29 NOTE — Telephone Encounter (Signed)
Discuss with patient  

## 2012-11-15 ENCOUNTER — Encounter: Payer: Self-pay | Admitting: Internal Medicine

## 2012-11-15 ENCOUNTER — Ambulatory Visit (INDEPENDENT_AMBULATORY_CARE_PROVIDER_SITE_OTHER): Payer: BC Managed Care – PPO | Admitting: Internal Medicine

## 2012-11-15 VITALS — BP 114/72 | HR 69 | Temp 98.6°F | Wt 125.0 lb

## 2012-11-15 DIAGNOSIS — M792 Neuralgia and neuritis, unspecified: Secondary | ICD-10-CM

## 2012-11-15 DIAGNOSIS — IMO0002 Reserved for concepts with insufficient information to code with codable children: Secondary | ICD-10-CM

## 2012-11-15 DIAGNOSIS — L739 Follicular disorder, unspecified: Secondary | ICD-10-CM

## 2012-11-15 DIAGNOSIS — L738 Other specified follicular disorders: Secondary | ICD-10-CM

## 2012-11-15 DIAGNOSIS — R197 Diarrhea, unspecified: Secondary | ICD-10-CM

## 2012-11-15 DIAGNOSIS — M25559 Pain in unspecified hip: Secondary | ICD-10-CM

## 2012-11-15 DIAGNOSIS — M25551 Pain in right hip: Secondary | ICD-10-CM

## 2012-11-15 MED ORDER — DOXYCYCLINE HYCLATE 100 MG PO TABS: 100.0000 mg | ORAL_TABLET | Freq: Two times a day (BID) | ORAL | Status: DC

## 2012-11-15 MED ORDER — GABAPENTIN 100 MG PO CAPS: ORAL_CAPSULE | ORAL | Status: DC

## 2012-11-15 NOTE — Progress Notes (Signed)
  Subjective:    Patient ID: Megan Downs, female    DOB: [birth year], 62 y.o.   MRN: [id number]  HPI  In the past month she's awakened 3 times with  burning and itching in the left upper extremity from the lower third of the upper arm with extension as far as the base of the  L thumb. She has noted tiny papules on the arm. She have similar symptoms during the day @ least once a day. Over a several week she's had some nausea and loose -frankly  watery diarrheal stools. She does work with [place] from [place].  She's had  constant pain in the right hip for least 3 weeks; it is worse with weight bearing. It occurred as she was climbing out of the swimming pool. Advil helps for 3-4 hours.  Significant past medical history includes kyphoscoliosis for which she was evaluated at the [facility]. Surgery was not an option. Review of Systems  She has no tingling or weakness of the left upper extremity. The right upper extremity is not affected.  She denies fever, chills, sweats. She's had no unexplained weight loss or abdominal pain.     Objective:   Physical Exam General appearance is one of good health and nourishment w/o distress.  Eyes: No conjunctival inflammation or scleral icterus is present.  Oral exam: Dental hygiene is good; lips and gums are healthy appearing.There is no oropharyngeal erythema or exudate noted.   Neck: Full range of motion with no replication of symptoms with range of motion  Heart:  Normal rate and regular rhythm. S1 and S2 normal without gallop, murmur, click, rub or other extra sounds     Lungs:Chest clear to auscultation; no wheezes, rhonchi,rales ,or rubs present.No increased work of breathing.   Chest: kyphoscoliosis  Abdomen: bowel sounds normal, soft and non-tender without masses, organomegaly or hernias noted.  No guarding or rebound   Skin:Warm & dry. Scattered small papular lesions over the left upper  extremity with evidence of excoriation  Neuro: Deep tendon reflexes normal. Strength and tone normal to opposition. Gait including tiptoe and heel walking is normal.  Musculoskeletal: Full range of motion at the hips. Negative straight leg raising to 115.  Lymphatic: No lymphadenopathy is noted about the head, neck, axilla, or epitrochlear areas.             Assessment & Plan:  #1 radicular pain left upper extremity also associated with some pruritis. No evidence clinically of  cervical radiculopathy  #2 folliculitis type lesions  #3 intermittent diarrhea  #4 hip pain; exam suggests that this may be a piriformis syndrome rather than skeletal  Plan: See orders and recommendations

## 2012-11-15 NOTE — Patient Instructions (Addendum)
Doxycycline is an excellent antibiotic for skin & GI infections. Direct sun exposure should be avoided as much as possible as photosensitivity skin rash  is a potential reaction. 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

## 2012-11-16 ENCOUNTER — Ambulatory Visit: Payer: BC Managed Care – PPO

## 2013-01-04 ENCOUNTER — Ambulatory Visit (INDEPENDENT_AMBULATORY_CARE_PROVIDER_SITE_OTHER): Payer: BC Managed Care – PPO | Admitting: Internal Medicine

## 2013-01-04 ENCOUNTER — Encounter: Payer: Self-pay | Admitting: Internal Medicine

## 2013-01-04 VITALS — BP 126/70 | HR 63 | Temp 98.5°F | Wt 123.0 lb

## 2013-01-04 DIAGNOSIS — J45901 Unspecified asthma with (acute) exacerbation: Secondary | ICD-10-CM

## 2013-01-04 DIAGNOSIS — J209 Acute bronchitis, unspecified: Secondary | ICD-10-CM

## 2013-01-04 DIAGNOSIS — J4521 Mild intermittent asthma with (acute) exacerbation: Secondary | ICD-10-CM

## 2013-01-04 DIAGNOSIS — M7071 Other bursitis of hip, right hip: Secondary | ICD-10-CM

## 2013-01-04 DIAGNOSIS — M76899 Other specified enthesopathies of unspecified lower limb, excluding foot: Secondary | ICD-10-CM

## 2013-01-04 DIAGNOSIS — J45909 Unspecified asthma, uncomplicated: Secondary | ICD-10-CM | POA: Insufficient documentation

## 2013-01-04 MED ORDER — TRAMADOL HCL 50 MG PO TABS: 50.0000 mg | ORAL_TABLET | Freq: Four times a day (QID) | ORAL | Status: DC | PRN

## 2013-01-04 MED ORDER — AZITHROMYCIN 250 MG PO TABS: ORAL_TABLET | ORAL | Status: DC

## 2013-01-04 MED ORDER — HYDROCODONE-HOMATROPINE 5-1.5 MG/5ML PO SYRP: 5.0000 mL | ORAL_SOLUTION | Freq: Four times a day (QID) | ORAL | Status: DC | PRN

## 2013-01-04 NOTE — Patient Instructions (Addendum)
Plain Mucinex (NOT D) for thick secretions ;force NON dairy fluids .  Avoid Dayquil & Nyquil. Nasal cleansing in the shower as discussed with lather of mild shampoo.After 10 seconds wash off lather while  exhaling through nostrils. Make sure that all residual soap is removed to prevent irritation.  Use a Neti pot daily only  as needed for significant sinus congestion; going from open side to congested side . Plain Allegra (NOT D )  160 daily , Loratidine 10 mg , OR Zyrtec 10 mg @ bedtime  as needed for itchy eyes & sneezing.

## 2013-01-04 NOTE — Progress Notes (Signed)
  Subjective:    Patient ID: Megan Downs, female    DOB: Oct 17, 1950, 62 y.o.   MRN: [id number]  HPI   She's had a nonproductive cough for one week associated with shortness of breath and wheezing after exposure to sick grandchildren. She's also had some ear discomfort without discharge. Delsym , guaifenesin , Dayquil & Nyquil w/o benefit.  Her Symbicort appears to be empty. She could not find her albuterol.  She has not on an ACE inhibitor.  Quit smoking 1977.      Review of Systems She specifically denies fever, chills, or sweats. She is not having frontal sinus pain, facial pain, nasal purulence, or dental pain.  She was diagnosed as having bursitis as etiology of her R  hip pain by [facility] orthopedics. This is worse climbing stairs Meloxicam provided initial response but this was only temporary. She also noted elevation of blood pressure with this medicine.     Objective:   Physical Exam  General appearance:good health ;well nourished; no acute distress or increased work of breathing is present.  No  lymphadenopathy about the head, neck, or axilla noted.   Eyes: No conjunctival inflammation or lid edema is present.   Ears:  External ear exam shows no significant lesions or deformities.  Otoscopic examination reveals clear canals, tympanic membranes are intact bilaterally without bulging, retraction, inflammation or discharge.  Nose:  External nasal examination shows no deformity or inflammation. Nasal mucosa are pink and moist without lesions or exudates. R septal dislocation .No obstruction to airflow.   Oral exam: Dental hygiene is good; lips and gums are healthy appearing.There is no oropharyngeal erythema or exudate noted.   Neck:  No deformities,  masses, or tenderness noted.     Heart:  Normal rate and regular rhythm. S1 and S2 normal without gallop,  click, rub or other extra sounds. Grade 1/6 systolic murmur  Lungs:Chest clear to auscultation; no wheezes,  rhonchi,rales ,or rubs present.No increased work of breathing. Severe brassy, nonproductive cough  Extremities:  No cyanosis, edema, or clubbing  noted . Full range of motion of the lower extremities with some subjective discomfort with range of motion of the right hip.  Musculoskeletal: Dramatic kyphoscoliotic changes of the thoracic spine   Skin: Warm & dry          Assessment & Plan:   #1 Acute bronchitis in the context of history of reactive airways disease  #2 right hip pain diagnosis bursitis  Plan: See orders and recommendations

## 2013-03-08 ENCOUNTER — Telehealth: Payer: Self-pay | Admitting: General Practice

## 2013-03-08 NOTE — Telephone Encounter (Signed)
Please advise if pt is to be on this medication. Shows on her chart that this was last filled in Dec 2013 #90 with 1 refill. Has not bee filled since. Last OV 7.29.14. Not listed as an active medication there.

## 2013-03-08 NOTE — Telephone Encounter (Signed)
#  45 , 1/2 qd

## 2013-03-09 ENCOUNTER — Other Ambulatory Visit: Payer: Self-pay | Admitting: *Deleted

## 2013-03-09 DIAGNOSIS — I10 Essential (primary) hypertension: Secondary | ICD-10-CM

## 2013-03-09 MED ORDER — METOPROLOL TARTRATE 25 MG PO TABS: 25.0000 mg | ORAL_TABLET | Freq: Every day | ORAL | Status: DC

## 2013-03-09 NOTE — Telephone Encounter (Signed)
Refill for metoprolol sent to [place] [facility], note that pt needs to schedule CPE.

## 2013-03-11 ENCOUNTER — Encounter: Payer: Self-pay | Admitting: Lab

## 2013-03-11 ENCOUNTER — Telehealth: Payer: Self-pay

## 2013-03-11 ENCOUNTER — Other Ambulatory Visit: Payer: Self-pay | Admitting: *Deleted

## 2013-03-11 MED ORDER — METOPROLOL SUCCINATE ER 25 MG PO TB24: ORAL_TABLET | ORAL | Status: DC

## 2013-03-11 NOTE — Telephone Encounter (Signed)
HM UTD WE flu and zostavax Meds reconciled, pharmacy and allergies verified

## 2013-03-11 NOTE — Telephone Encounter (Signed)
Received phone call from [facility] and they stated the last prescription they received from our office was on 05/24/2012 for Metoprolol Succinate 25 mg ER 1/2 tab one daily. Med refilled. #45, 0 refills

## 2013-03-14 ENCOUNTER — Ambulatory Visit (INDEPENDENT_AMBULATORY_CARE_PROVIDER_SITE_OTHER): Payer: BC Managed Care – PPO | Admitting: Internal Medicine

## 2013-03-14 ENCOUNTER — Encounter: Payer: Self-pay | Admitting: Internal Medicine

## 2013-03-14 VITALS — BP 138/79 | HR 64 | Temp 98.2°F | Resp 12 | Ht 60.25 in | Wt 123.4 lb

## 2013-03-14 DIAGNOSIS — E782 Mixed hyperlipidemia: Secondary | ICD-10-CM

## 2013-03-14 DIAGNOSIS — J45909 Unspecified asthma, uncomplicated: Secondary | ICD-10-CM

## 2013-03-14 DIAGNOSIS — R059 Cough, unspecified: Secondary | ICD-10-CM

## 2013-03-14 DIAGNOSIS — M899 Disorder of bone, unspecified: Secondary | ICD-10-CM

## 2013-03-14 DIAGNOSIS — Z0000 Encounter for general adult medical examination without abnormal findings: Secondary | ICD-10-CM

## 2013-03-14 DIAGNOSIS — R05 Cough: Secondary | ICD-10-CM

## 2013-03-14 DIAGNOSIS — Z23 Encounter for immunization: Secondary | ICD-10-CM

## 2013-03-14 MED ORDER — METOPROLOL SUCCINATE ER 25 MG PO TB24: 25.0000 mg | ORAL_TABLET | Freq: Every day | ORAL | Status: DC

## 2013-03-14 NOTE — Progress Notes (Signed)
  Subjective:    Patient ID: Megan Downs, female    DOB: Jan 23, 1951, 62 y.o.   MRN: [id number]  HPI  She is here for a physical;acute issues include persistent R hip pain in context of scoliosis.     Review of Systems  She also continues to have intermittent cough productive of scant sputum which is attributed to postnasal drainage. She has only minor extrinsic symptoms. Antihistamines, Singulair, Fluticasone and inhalers have not been of benefit. She has 2 dogs & lives in an old house. She does have a history of reactive airways disease. Although she only smoked for 2 years she had significant secondhand exposure from her family growing up. Specifically 5 family members smoke    Objective:   Physical Exam Gen.:  well-nourished in appearance. Alert, appropriate and cooperative throughout exam.Appears younger than stated age  Head: Normocephalic without obvious abnormalities Eyes: No corneal or conjunctival inflammation noted. Pupils equal round reactive to light and accommodation.  Extraocular motion intact. Vision grossly normal without lenses Ears: External  ear exam reveals no significant lesions or deformities. Canals clear .TMs normal. Hearing is grossly normal bilaterally. Nose: External nasal exam reveals no deformity or inflammation. Nasal mucosa are pink and moist. No lesions or exudates noted. Septum dislocated to R Mouth: Oral mucosa and oropharynx reveal no lesions or exudates. Teeth in good repair. Neck: No deformities, masses, or tenderness noted. Range of motion & Thyroid normal. Lungs: Normal respiratory effort; chest expands symmetrically. Lungs are clear to auscultation without rales, wheezes, or increased work of breathing. Heart: Normal rate and rhythm. Normal S1 and S2. No gallop, click, or rub.S4 w/o murmur. Abdomen: Bowel sounds normal; abdomen soft and nontender. No masses, organomegaly or hernias noted. Genitalia: As per Gyn                                   Musculoskeletal/extremities: There is significant thoracic kyphoscoliosis. No clubbing, cyanosis, edema, or significant extremity  deformity noted. Range of motion normal .Tone & strength  Normal. Joints normal. Nail health good. Able to lie down & sit up w/o help. Negative SLR bilaterally to 90 degrees Vascular: Carotid, radial artery, dorsalis pedis and  posterior tibial pulses are full and equal. No bruits present. Neurologic: Alert and oriented x3. Deep tendon reflexes symmetrical and normal.        Skin: Intact without suspicious lesions or rashes. Lymph: No cervical, axillary lymphadenopathy present. Psych: Mood and affect are normal. Normally interactive                                                                                        Assessment & Plan:  #1 comprehensive physical exam; no acute findings  Plan: see Orders  & Recommendations

## 2013-03-14 NOTE — Patient Instructions (Addendum)
Minimal Blood Pressure Goal= AVERAGE < 140/90;  Ideal is an AVERAGE < 135/85. This AVERAGE should be calculated from @ least 5-7 BP readings taken @ different times of day on different days of week. You should not respond to isolated BP readings , but rather the AVERAGE for that week .Please bring your  blood pressure cuff to office visits to verify that it is reliable.It  can also be checked against the blood pressure device at the pharmacy. 

## 2013-03-15 ENCOUNTER — Other Ambulatory Visit: Payer: BC Managed Care – PPO

## 2013-03-15 ENCOUNTER — Encounter: Payer: Self-pay | Admitting: Internal Medicine

## 2013-03-15 ENCOUNTER — Ambulatory Visit (INDEPENDENT_AMBULATORY_CARE_PROVIDER_SITE_OTHER): Payer: BC Managed Care – PPO | Admitting: Internal Medicine

## 2013-03-15 ENCOUNTER — Ambulatory Visit: Payer: BC Managed Care – PPO | Admitting: Internal Medicine

## 2013-03-15 ENCOUNTER — Ambulatory Visit (INDEPENDENT_AMBULATORY_CARE_PROVIDER_SITE_OTHER)
Admission: RE | Admit: 2013-03-15 | Discharge: 2013-03-15 | Disposition: A | Discharge status: Home / Self Care | Payer: BC Managed Care – PPO | Source: Ambulatory Visit | Attending: Internal Medicine | Admitting: Internal Medicine

## 2013-03-15 VITALS — BP 114/68 | HR 57 | Ht 60.0 in | Wt 124.8 lb

## 2013-03-15 DIAGNOSIS — R059 Cough, unspecified: Secondary | ICD-10-CM

## 2013-03-15 DIAGNOSIS — J309 Allergic rhinitis, unspecified: Secondary | ICD-10-CM

## 2013-03-15 DIAGNOSIS — K219 Gastro-esophageal reflux disease without esophagitis: Secondary | ICD-10-CM

## 2013-03-15 DIAGNOSIS — R05 Cough: Secondary | ICD-10-CM

## 2013-03-15 DIAGNOSIS — J45909 Unspecified asthma, uncomplicated: Secondary | ICD-10-CM

## 2013-03-15 MED ORDER — AZELASTINE-FLUTICASONE 137-50 MCG/ACT NA SUSP: 2.0000 | Freq: Every day | NASAL | Status: DC

## 2013-03-15 NOTE — Patient Instructions (Addendum)
Order allergy profile      Dx allergic rhinitis  Order- CXR    Dc chronic cough  Sample Dymista nasal spray    1-2 puffs each nostril once daily at bedtime  Then try :  Sample Spiriva inhaler     1 inhaled daily  Pay attention to the reflux- it may be an important part of the airway irritation over time.

## 2013-03-15 NOTE — Progress Notes (Signed)
03/15/13- 58 yoF former smoker referred courtesy of Dr Alwyn Ren; chronic cough and drainage. Bothersome chronic cough, present for years, worse lying down or with eating. Smoked one pack per day for only a few years, ending in 78. Had flu vaccine. Describes perennial head congestion with mucus and drainage, cough occasionally productive with episodes of bronchitis. Sputum mostly white. Mild asthma with occasional wheeze. Breathing does not wake her. Symbicort no help. Bothersome postnasal drip. Remote pneumonia. Chest x-ray 2013 question middle lobe density. Severe scoliosis-swims and does yoga. Grew up in a smoking family. X line history of reflux worse with acid foods. Remote history of hives associated with exercise. CXR 09/03/11 IMPRESSION:  1. No acute disease.  2. Findings compatible with emphysema.  3. Marked scoliosis.  Original Report Authenticated By: Bernadene Bell. Maricela Curet, M.D.  Prior to Admission medications   Medication Sig Start Date End Date Taking? Authorizing Provider  aspirin 81 MG tablet Take 81 mg by mouth daily.     Yes Historical Provider, MD  CALCIUM CARBONATE PO Take by mouth daily.     Yes Historical Provider, MD  MAGNESIUM PO Take by mouth daily.     Yes Historical Provider, MD  metoprolol succinate (TOPROL-XL) 25 MG 24 hr tablet Take 1/2 tab by mouth twice daily 03/14/13  Yes Pecola Lawless, MD  Omega-3 Fatty Acids (OMEGA 3 PO) Take by mouth daily.     Yes Historical Provider, MD  tiotropium (SPIRIVA) 18 MCG inhalation capsule Place 18 mcg into inhaler and inhale daily.   Yes Historical Provider, MD  VITAMIN D, CHOLECALCIFEROL, PO Take by mouth.     Yes Historical Provider, MD  vitamin E 400 UNIT capsule Take 400 Units by mouth daily.     Yes Historical Provider, MD  Azelastine-Fluticasone (DYMISTA) 137-50 MCG/ACT SUSP Place 2 sprays into both nostrils at bedtime. 03/15/13   Waymon Budge, MD   Past Surgical History  Procedure Laterality Date  . Tonsillectomy     . Toe surgery    . Cystoscopy       for anormal kineys;Dr Nesi  . Colonoscopy  2010    Dr Reece Agar, due 2020   Past Medical History  Diagnosis Date  . Hyperlipidemia   . MVP (mitral valve prolapse)     Dr Amil Amen ,Cardiology  stated no SBE prophylaxis  . Medullary sponge kidney   . 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 Allergy   . Hypertension    Family History  Problem Relation Age of Onset  . Breast cancer Mother   . Cancer Father     esophageal cancer  . Prostate cancer Brother   . Lung cancer Brother     2 brothers  . Heart attack Maternal Grandfather     in 3s  . Heart attack Maternal Grandmother     in 24s  . Heart failure Maternal Grandmother 76  . Kidney failure Paternal Grandmother   . Heart failure Paternal Grandfather     in 52s  . Asthma Neg Hx   . COPD Neg Hx   . Diabetes Neg Hx    History   Social History  . Marital Status: Married    Spouse Name: N/A    Number of Children: 2  . Years of Education: N/A   Occupational History  . SMALL BUSINESS OWNER    Social History Main Topics  . Smoking status: Former Smoker -- 1.00 packs/day for 2  years    Types: Cigarettes    Quit date: 06/09/1972  . Smokeless tobacco: Not on file     Comment: Smoked 1972-1974, up to 1 ppd. Second hand smoke growing up  . Alcohol Use: 4.2 oz/week    7 Glasses of wine per week  . Drug Use: No  . Sexual Activity: Not on file   Other Topics Concern  . Not on file   Social History Narrative   REGULAR EXERCISE   ROS-see HPI Constitutional:   No-   weight loss, night sweats, fevers, chills, fatigue, lassitude. HEENT:   No-  headaches, difficulty swallowing, tooth/dental problems, sore throat,       No-  sneezing, itching, ear ache, nasal congestion, post nasal drip,  CV:  No-   chest pain, orthopnea, PND, swelling in lower extremities, anasarca, dizziness, palpitations Resp: + shortness of breath with exertion or at rest.               +  productive cough,  No non-productive cough,  No- coughing up of blood.              No-   change in color of mucus.  + wheezing.   Skin: No-   rash or lesions. GI:  + heartburn, indigestion, no-abdominal pain, nausea, vomiting, diarrhea,                 change in bowel habits, loss of appetite GU: No-   dysuria, change in color of urine, no urgency or frequency.  No- flank pain. MS:  No-   joint pain or swelling.  No- decreased range of motion.  + back pain. Neuro-     nothing unusual Psych:  No- change in mood or affect. No depression or anxiety.  No memory loss.  OBJ- Physical Exam General- Alert, Oriented, Affect-appropriate, Distress- none acute Skin- rash-none, lesions- none, excoriation- none Lymphadenopathy- none Head- atraumatic            Eyes- Gross vision intact, PERRLA, conjunctivae and secretions clear            Ears- Hearing, canals-normal            Nose- Clear, +Septal dev, no-mucus, polyps, erosion, perforation             Throat- Mallampati II , mucosa clear , drainage- none, tonsils- atrophic Neck- flexible , trachea midline, no stridor , thyroid nl, carotid no bruit Chest - symmetrical excursion , unlabored           Heart/CV- RRR , no murmur , no gallop  , no rub, nl s1 s2                           - JVD- none , edema- none, stasis changes- none, varices- none           Lung- clear to P&A, wheeze- none, cough- none , dullness-none, rub- none           Chest wall- scoliosis Abd- tender-no, distended-no, bowel sounds-present, HSM- no Br/ Gen/ Rectal- Not done, not indicated Extrem- cyanosis- none, clubbing, none, atrophy- none, strength- nl Neuro- grossly intact to observation

## 2013-03-16 LAB — ALLERGY FULL PROFILE
Allergen,Goose feathers, e70: <0.1 kU/L
Alternaria Alternata: <0.1 kU/L
Bahia Grass: <0.1 kU/L
Bermuda Grass: <0.1 kU/L
Box Elder IgE: <0.1 kU/L
Candida Albicans: <0.1 kU/L
Cat Dander: <0.1 kU/L
Curvularia lunata: <0.1 kU/L
Elm IgE: <0.1 kU/L
Fescue: <0.1 kU/L
G005 Rye, Perennial: <0.1 kU/L
G009 Red Top: <0.1 kU/L
Lamb's Quarters: <0.1 kU/L
Oak: <0.1 kU/L
Stemphylium Botryosum: <0.1 kU/L
Timothy Grass: <0.1 kU/L

## 2013-03-21 ENCOUNTER — Ambulatory Visit (INDEPENDENT_AMBULATORY_CARE_PROVIDER_SITE_OTHER): Payer: BC Managed Care – PPO | Admitting: *Deleted

## 2013-03-21 ENCOUNTER — Other Ambulatory Visit (INDEPENDENT_AMBULATORY_CARE_PROVIDER_SITE_OTHER): Payer: BC Managed Care – PPO

## 2013-03-21 DIAGNOSIS — Z23 Encounter for immunization: Secondary | ICD-10-CM

## 2013-03-21 DIAGNOSIS — Z0000 Encounter for general adult medical examination without abnormal findings: Secondary | ICD-10-CM

## 2013-03-21 LAB — BASIC METABOLIC PANEL
CO2: 28 mEq/L (ref 19–32)
Chloride: 103 mEq/L (ref 96–112)
GFR: 94.26 mL/min (ref >60.00)
Glucose, Bld: 99 mg/dL (ref 70–99)
Potassium: 4.2 mEq/L (ref 3.5–5.1)
Sodium: 140 mEq/L (ref 135–145)

## 2013-03-21 LAB — HEPATIC FUNCTION PANEL
ALT: 25 U/L (ref 0–35)
AST: 20 U/L (ref 0–37)
Albumin: 4.2 g/dL (ref 3.5–5.2)
Total Protein: 6.6 g/dL (ref 6.0–8.3)

## 2013-03-21 LAB — CBC WITH DIFFERENTIAL/PLATELET
Basophils Absolute: 0 10*3/uL (ref 0.0–0.1)
Basophils Relative: 0.7 % (ref 0.0–3.0)
HCT: 42.3 % (ref 36.0–46.0)
Hemoglobin: 14.5 g/dL (ref 12.0–15.0)
Lymphocytes Relative: 41 % (ref 12.0–46.0)
Lymphs Abs: 1.7 10*3/uL (ref 0.7–4.0)
MCHC: 34.3 g/dL (ref 30.0–36.0)
MCV: 91.5 fl (ref 78.0–100.0)
Monocytes Relative: 9.1 % (ref 3.0–12.0)
Neutro Abs: 2 10*3/uL (ref 1.4–7.7)
RBC: 4.62 Mil/uL (ref 3.87–5.11)
RDW: 12.5 % (ref 11.5–14.6)

## 2013-03-21 LAB — LDL CHOLESTEROL, DIRECT: Direct LDL: 133.2 mg/dL

## 2013-03-21 LAB — LIPID PANEL
Cholesterol: 224 mg/dL — ABNORMAL HIGH (ref 0–200)
VLDL: 31 mg/dL (ref 0.0–40.0)

## 2013-03-21 LAB — TSH: TSH: 2.23 u[IU]/mL (ref 0.35–5.50)

## 2013-03-26 NOTE — Assessment & Plan Note (Addendum)
Chronic bronchitis pattern with an asthma component. Possible contribution from postnasal drip and reflux. Plan-chest x-ray, allergy profile, sample Spiriva

## 2013-03-26 NOTE — Assessment & Plan Note (Signed)
Educated on the importance of reflux control

## 2013-03-26 NOTE — Assessment & Plan Note (Signed)
Plan-allergy profile, sample Dymista

## 2013-03-28 LAB — VITAMIN D 1,25 DIHYDROXY
Vitamin D2 1, 25 (OH)2: <8 pg/mL
Vitamin D3 1, 25 (OH)2: 44 pg/mL

## 2013-04-05 ENCOUNTER — Ambulatory Visit: Payer: BC Managed Care – PPO

## 2013-04-11 ENCOUNTER — Ambulatory Visit (INDEPENDENT_AMBULATORY_CARE_PROVIDER_SITE_OTHER): Payer: BC Managed Care – PPO | Admitting: *Deleted

## 2013-04-11 DIAGNOSIS — Z2911 Encounter for prophylactic immunotherapy for respiratory syncytial virus (RSV): Secondary | ICD-10-CM

## 2013-04-11 DIAGNOSIS — Z23 Encounter for immunization: Secondary | ICD-10-CM

## 2013-05-05 ENCOUNTER — Encounter: Payer: Self-pay | Admitting: Internal Medicine

## 2013-05-09 ENCOUNTER — Telehealth: Payer: Self-pay | Admitting: *Deleted

## 2013-05-09 ENCOUNTER — Encounter: Payer: Self-pay | Admitting: *Deleted

## 2013-05-09 NOTE — Telephone Encounter (Signed)
Letter mailed.//AB/CMA 

## 2013-05-09 NOTE — Telephone Encounter (Signed)
Message copied by Verdie Shire on Mon May 09, 2013  1:23 PM ------      Message from: Pecola Lawless      Created: Thu May 05, 2013  7:04 AM       Findings : lowest T score - 2.1 @  femoral neck (hip)      Diagnosis: significant Osteopenia but calculated fracture risk low (1.7 % @ hip)      Recommended lifestyle interventions to prevent progression to Osteoporosis include calcium 600 mg daily  & vitamin D3 supplementation to keep vit D  level @ least 40-60. The usual vitamin D3 dose is 1000 IU daily; but individual dose is determined by annual vitamin D level monitor. Also weight bearing exercise such as  walking 30-45 minutes 3-4  X per week is recommended.       Repeat BMD every 25 months.              ------

## 2013-05-16 ENCOUNTER — Ambulatory Visit (INDEPENDENT_AMBULATORY_CARE_PROVIDER_SITE_OTHER): Payer: BC Managed Care – PPO | Admitting: Internal Medicine

## 2013-05-16 ENCOUNTER — Encounter: Payer: Self-pay | Admitting: Internal Medicine

## 2013-05-16 VITALS — BP 122/82 | HR 59 | Ht 60.0 in | Wt 127.2 lb

## 2013-05-16 DIAGNOSIS — J45909 Unspecified asthma, uncomplicated: Secondary | ICD-10-CM

## 2013-05-16 DIAGNOSIS — J310 Chronic rhinitis: Secondary | ICD-10-CM

## 2013-05-16 DIAGNOSIS — J45998 Other asthma: Secondary | ICD-10-CM

## 2013-05-16 MED ORDER — AZELASTINE-FLUTICASONE 137-50 MCG/ACT NA SUSP: NASAL | Status: DC

## 2013-05-16 NOTE — Patient Instructions (Signed)
Script sent for Dymista.   Please call as needed

## 2013-05-16 NOTE — Progress Notes (Signed)
03/15/13- 70 yoF former smoker referred courtesy of Dr Alwyn Ren; chronic cough and drainage. Bothersome chronic cough, present for years, worse lying down or with eating. Smoked one pack per day for only a few years, ending in 24. Had flu vaccine. Describes perennial head congestion with mucus and drainage, cough occasionally productive with episodes of bronchitis. Sputum mostly white. Mild asthma with occasional wheeze. Breathing does not wake her. Symbicort no help. Bothersome postnasal drip. Remote pneumonia. Chest x-ray 2013 question middle lobe density. Severe scoliosis-swims and does yoga. Grew up in a smoking family. X line history of reflux worse with acid foods. Remote history of hives associated with exercise. CXR 09/03/11 IMPRESSION:  1. No acute disease.  2. Findings compatible with emphysema.  3. Marked scoliosis.  Original Report Authenticated By: Bernadene Bell. D'ALESSIO, M.D.  05/16/13-62 yoF former smoker referred courtesy of Dr Alwyn Ren; chronic rhinitis and bronchitis, complicated by scoliosis FOLLOWS FOR: feels like allergies have gotten worse since last visit; states she is having alot of drainage from nasal area to throat-sometimes can get mucus up and its brown in color. Still persistent head congestion and sneezing. Scant nasal discharge. Persistent nonproductive cough Allergy Profile 03/15/2013-total IgE 11.6 with elevations only for ragweed. Spiriva made cough worse. Dymista nasal spray was a big help and also reduced cough. CXR 03/17/13 IMPRESSION:  No active cardiopulmonary disease. Chronic findings as described  above.  Electronically Signed  By: Roque Lias M.D.  On: 03/15/2013 16:29  ROS-see HPI Constitutional:   No-   weight loss, night sweats, fevers, chills, fatigue, lassitude. HEENT:   No-  headaches, difficulty swallowing, tooth/dental problems, sore throat,       No-  sneezing, itching, ear ache, nasal congestion, +post nasal drip,  CV:  No-   chest pain,  orthopnea, PND, swelling in lower extremities, anasarca, dizziness, palpitations Resp: + shortness of breath with exertion or at rest.              No-  productive cough, + non-productive cough,  No- coughing up of blood.              No-   change in color of mucus.  No- wheezing.   Skin: No-   rash or lesions. GI:  + heartburn, indigestion, no-abdominal pain, nausea, vomiting, diarrhea,                 change in bowel habits, loss of appetite GU: No-   dysuria, change in color of urine, no urgency or frequency.  No- flank pain. MS:  No-   joint pain or swelling.  No- decreased range of motion.  + back pain. Neuro-     nothing unusual Psych:  No- change in mood or affect. No depression or anxiety.  No memory loss.  OBJ- Physical Exam General- Alert, Oriented, Affect-appropriate, Distress- none acute Skin- rash-none, lesions- none, excoriation- none Lymphadenopathy- none Head- atraumatic            Eyes- Gross vision intact, PERRLA, conjunctivae and secretions clear            Ears- Hearing, canals-normal            Nose- +sniffing, +Septal dev, no-mucus, polyps, erosion, perforation             Throat- Mallampati II , mucosa clear , drainage- none, tonsils- atrophic Neck- flexible , trachea midline, no stridor , thyroid nl, carotid no bruit Chest - symmetrical excursion , unlabored  Heart/CV- RRR , no murmur , no gallop  , no rub, nl s1 s2                           - JVD- none , edema- none, stasis changes- none, varices- none           Lung- clear to P&A, wheeze- none, cough- none , dullness-none, rub- none           Chest wall- scoliosis Abd-  Br/ Gen/ Rectal- Not done, not indicated Extrem- cyanosis- none, clubbing, none, atrophy- none, strength- nl Neuro- grossly intact to observation

## 2013-05-18 ENCOUNTER — Institutional Professional Consult (permissible substitution): Payer: BC Managed Care – PPO | Admitting: Internal Medicine

## 2013-05-31 ENCOUNTER — Telehealth: Payer: Self-pay | Admitting: Internal Medicine

## 2013-05-31 MED ORDER — AZELASTINE-FLUTICASONE 137-50 MCG/ACT NA SUSP: NASAL | Status: DC

## 2013-05-31 NOTE — Telephone Encounter (Signed)
Left detailed message rx for dymista was resent to [facility].  Pt to call back if has any further questions. Nothing further needed.

## 2013-06-06 NOTE — Assessment & Plan Note (Signed)
Allergy Profile 03/15/2013-total IgE 11.6 with elevations only for ragweed. We do not have a defined allergic basis but she has a history of cigarette smoke exposure. Clinical response to Dymista suggests postnasal drip contributing to cough. Plan-watch response to Dymista through season changes.

## 2013-06-06 NOTE — Assessment & Plan Note (Signed)
Allergy Profile 03/15/2013-total IgE 11.6 with elevations only for ragweed. Nonallergic rhinitis based on blood test. Skin testing may be more sensitive if necessary. Good clinical response to Dymista Plan-prescription for Dymista, skin test if needed

## 2013-06-27 ENCOUNTER — Encounter: Payer: Self-pay | Admitting: *Deleted

## 2013-06-28 ENCOUNTER — Encounter: Payer: Self-pay | Admitting: Nurse Practitioner

## 2013-06-28 ENCOUNTER — Ambulatory Visit (INDEPENDENT_AMBULATORY_CARE_PROVIDER_SITE_OTHER): Payer: BC Managed Care – PPO | Admitting: Nurse Practitioner

## 2013-06-28 VITALS — BP 131/84 | HR 60 | Resp 18 | Ht 60.25 in | Wt 127.8 lb

## 2013-06-28 DIAGNOSIS — Z01419 Encounter for gynecological examination (general) (routine) without abnormal findings: Secondary | ICD-10-CM

## 2013-06-28 DIAGNOSIS — R350 Frequency of micturition: Secondary | ICD-10-CM

## 2013-06-28 NOTE — Patient Instructions (Signed)

## 2013-06-28 NOTE — Progress Notes (Signed)
63 y.o. G28P2002 Married Caucasian Fe here for annual exam.  Saw Dr. Annamaria Boots and has a chronic dry cough and nasal drainage.  Maybe doing allergy testing in the spring.  No LMP recorded. Patient is postmenopausal.          Sexually active: yes  The current method of family planning is none.    Exercising: yes  Swimming + yoga Smoker: Former  Health Maintenance: Pap:  06/22/12 - Normal, with negative HR HPV MMG:  August 2014 @ [facility] Colonoscopy: 12/2009 negative and repeat in 5 years BMD:   Fall 2014 TDaP: Td 09/18/2009 Labs: Done by PCP   reports that she quit smoking about 41 years ago. Her smoking use included Cigarettes. She has a 2 pack-year smoking history. She has never used smokeless tobacco. She reports that she drinks about 1.8 ounces of alcohol per week. She reports that she does not use illicit drugs.  Past Medical History  Diagnosis Date  . Hyperlipidemia   . MVP (mitral valve prolapse)     Dr Leonia Reeves ,Cardiology  stated no SBE prophylaxis  . Medullary sponge kidney   . Angiolipoma of kidney     presentation as microscopic hematuria, Dr Janice Norrie  . Allergy     Environmental   . Hypertension   . Osteopenia     Past Surgical History  Procedure Laterality Date  . Tonsillectomy    . Toe surgery Right 2009    great toe, cleaning joint  . Cystoscopy  2004     for anormal kineys;Dr Nesi  . Hysteroscopy with resectoscope  2012    eval secondary to PMB with path benign    Current Outpatient Prescriptions  Medication Sig Dispense Refill  . aspirin 81 MG tablet Take 81 mg by mouth daily.        . Azelastine-Fluticasone (DYMISTA) 137-50 MCG/ACT SUSP 1-2 puffs each nostril once daily at bedtime  1 Bottle  3  . CALCIUM CARBONATE PO Take by mouth daily.        Marland Kitchen MAGNESIUM PO Take by mouth daily.        . metoprolol succinate (TOPROL-XL) 25 MG 24 hr tablet Take 1/2 tab by mouth twice daily      . Omega-3 Fatty Acids (OMEGA 3 PO) Take by mouth daily.        Marland Kitchen VITAMIN D,  CHOLECALCIFEROL, PO Take by mouth.        . vitamin E 400 UNIT capsule Take 400 Units by mouth daily.         No current facility-administered medications for this visit.    Family History  Problem Relation Age of Onset  . Breast cancer Mother   . Cancer Father     esophageal cancer  . Prostate cancer Brother   . Lung cancer Brother     2 brothers  . Heart attack Maternal Grandfather     in 1s  . Heart attack Maternal Grandmother     in 75s  . Heart failure Maternal Grandmother 76  . Kidney failure Paternal Grandmother   . Heart failure Paternal Grandfather     in 16s  . Asthma Neg Hx   . COPD Neg Hx   . Diabetes Neg Hx     ROS:  Pertinent items are noted in HPI.  Otherwise, a comprehensive ROS was negative.  Exam:   BP 131/84  Pulse 60  Resp 18  Ht 5' 0.25" (1.53 m)  Wt 127 lb 12.8 oz (57.97 kg)  BMI 24.76 kg/m2 Height: 5' 0.25" (153 cm)  Ht Readings from Last 3 Encounters:  06/28/13 5' 0.25" (1.53 m)  05/16/13 5' (1.524 m)  03/15/13 5' (1.524 m)    General appearance: alert, cooperative and appears stated age Head: Normocephalic, without obvious abnormality, atraumatic Neck: no adenopathy, supple, symmetrical, trachea midline and thyroid normal to inspection and palpation Lungs: clear to auscultation bilaterally, no rales.  Cough is dry. Back:  She has scoliosis of lumbar and thoracic spine Breasts: normal appearance, no masses or tenderness Heart: regular rate and rhythm Abdomen: soft, non-tender; no masses,  no organomegaly Extremities: extremities normal, atraumatic, no cyanosis or edema Skin: Skin color, texture, turgor normal. No rashes or lesions Lymph nodes: Cervical, supraclavicular, and axillary nodes normal. No abnormal inguinal nodes palpated Neurologic: Grossly normal   Pelvic: External genitalia:  no lesions              Urethra:  normal appearing urethra with no masses, tenderness or lesions              Bartholin's and Skene's: normal                  Vagina: normal appearing vagina with normal color and discharge, no lesions              Cervix: anteverted              Pap taken: no Bimanual Exam:  Uterus:  normal size, contour, position, consistency, mobility, non-tender              Adnexa: no mass, fullness, tenderness               Rectovaginal: Confirms               Anus:  normal sphincter tone, no lesions  A:  Well Woman with normal exam  Postmenopausal  S/P H'Scopic Resection of endo polyp w/ PMB 06/2010  History of chronic cough - followed by pulmonologist  (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

## 2013-06-29 ENCOUNTER — Encounter: Payer: Self-pay | Admitting: Internal Medicine

## 2013-06-29 LAB — URINALYSIS, MICROSCOPIC ONLY
Bacteria, UA: NONE SEEN
CASTS: NONE SEEN
CRYSTALS: NONE SEEN
Squamous Epithelial / LPF: NONE SEEN

## 2013-07-01 LAB — URINE CULTURE

## 2013-07-01 NOTE — Progress Notes (Signed)
Encounter reviewed by Dr. Fernado Brigante Silva.  

## 2013-07-04 ENCOUNTER — Telehealth: Payer: Self-pay | Admitting: Nurse Practitioner

## 2013-07-04 MED ORDER — NITROFURANTOIN MONOHYD MACRO 100 MG PO CAPS: 100.0000 mg | ORAL_CAPSULE | Freq: Two times a day (BID) | ORAL | Status: DC

## 2013-07-04 NOTE — Telephone Encounter (Signed)
Patient calling for recent urinalysis results. °

## 2013-07-04 NOTE — Telephone Encounter (Signed)
Message left to return call to [facility] at [phone].    

## 2013-07-04 NOTE — Telephone Encounter (Signed)
Patient returned phone call. Advised [facility] [place]. Sent to pharmacy of choice. Will call to schedule TOC when returns to town.

## 2013-07-04 NOTE — Telephone Encounter (Signed)
Patient returned call, she is in [place] so she will find out what pharmacy to send it to.

## 2013-07-04 NOTE — Telephone Encounter (Signed)
Message copied by Michele Mcalpine on Mon Jul 04, 2013  2:12 PM ------      Message from: Kem Boroughs R      Created: Mon Jul 04, 2013 12:48 PM       Let patient know that her urine culture did show infection and needs to start on Macrobid 100 mg BID #14, then TOC in 2 wks. ------

## 2013-07-12 ENCOUNTER — Ambulatory Visit (INDEPENDENT_AMBULATORY_CARE_PROVIDER_SITE_OTHER): Payer: BC Managed Care – PPO | Admitting: *Deleted

## 2013-07-12 VITALS — BP 107/69 | HR 62 | Resp 16 | Wt 128.0 lb

## 2013-07-12 DIAGNOSIS — N390 Urinary tract infection, site not specified: Secondary | ICD-10-CM

## 2013-07-12 NOTE — Progress Notes (Signed)
Patient in to repeat urine culture. Patient states she finish her abx yesterday and is feeling much better. She states she still has mild urgency but no other concerns.

## 2013-07-13 LAB — URINE CULTURE
Colony Count: NO GROWTH
Organism ID, Bacteria: NO GROWTH

## 2013-07-28 ENCOUNTER — Other Ambulatory Visit: Payer: Self-pay

## 2013-07-28 MED ORDER — METOPROLOL SUCCINATE ER 25 MG PO TB24: ORAL_TABLET | ORAL | Status: DC

## 2013-08-22 ENCOUNTER — Other Ambulatory Visit: Payer: Self-pay | Admitting: *Deleted

## 2013-08-22 MED ORDER — METOPROLOL SUCCINATE ER 25 MG PO TB24: ORAL_TABLET | ORAL | Status: DC

## 2013-08-24 ENCOUNTER — Telehealth: Payer: Self-pay | Admitting: Internal Medicine

## 2013-08-24 MED ORDER — METOPROLOL SUCCINATE ER 25 MG PO TB24: ORAL_TABLET | ORAL | Status: DC

## 2013-08-24 NOTE — Telephone Encounter (Signed)
Patient called and stated that she needs a refill for metoprolol succinate (TOPROL-XL) 25 MG 24 hr tablet and for it to be sent to [facility] on [address].

## 2013-08-24 NOTE — Telephone Encounter (Signed)
Rx sent to the pharmacy by e-script.//AB/CMA 

## 2013-08-25 ENCOUNTER — Other Ambulatory Visit: Payer: Self-pay | Admitting: *Deleted

## 2013-08-25 MED ORDER — METOPROLOL SUCCINATE ER 25 MG PO TB24: ORAL_TABLET | ORAL | Status: DC

## 2013-10-09 ENCOUNTER — Ambulatory Visit (INDEPENDENT_AMBULATORY_CARE_PROVIDER_SITE_OTHER): Payer: BC Managed Care – PPO | Admitting: Family Medicine

## 2013-10-09 VITALS — BP 128/90 | HR 66 | Temp 98.4°F | Resp 16 | Ht 58.75 in | Wt 124.4 lb

## 2013-10-09 DIAGNOSIS — H1130 Conjunctival hemorrhage, unspecified eye: Secondary | ICD-10-CM

## 2013-10-09 DIAGNOSIS — H1131 Conjunctival hemorrhage, right eye: Secondary | ICD-10-CM

## 2013-10-09 NOTE — Progress Notes (Signed)
° °  Subjective:    Patient ID: Megan Downs, female    DOB: [birth year], 63 y.o.   MRN: [id number]  Eye Problem  Associated symptoms include eye redness.   Chief Complaint  Patient presents with   Eye Problem    x 1 week    This chart was scribed for [name], MD by Thea Alken, ED Scribe. This patient was seen in room 3 and the patient's care was started at 12:36 PM.  HPI Comments: Megan Downs is a 63 y.o. female who presents to the [facility] complaining of worsening right conjunctivitis onset 4-5 days. Pt reports symptoms started with erythema on bottom of right eye but now has turned darker color with has right eye pain. She reports that she woke up with this issue. She reports pain when looking to her left.  Pt reports that she takes baby aspirin once a day. 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 She reports that she does not have head in water.  Past Medical History  Diagnosis Date   Hyperlipidemia    MVP (mitral valve prolapse)     Dr Leonia Reeves ,Cardiology  stated no SBE prophylaxis   Medullary sponge kidney    Angiolipoma of kidney     presentation as microscopic hematuria, Dr Janice Norrie   Allergy     Environmental    Hypertension    Osteopenia    No Known Allergies Prior to Admission medications   Medication Sig Start Date End Date Taking? Authorizing Provider  aspirin 81 MG tablet Take 81 mg by mouth daily.     Yes Historical Provider, MD  CALCIUM CARBONATE PO Take by mouth daily.     Yes Historical Provider, MD  MAGNESIUM PO Take by mouth daily.     Yes Historical Provider, MD  Omega-3 Fatty Acids (OMEGA 3 PO) Take by mouth daily.     Yes Historical Provider, MD  VITAMIN D, CHOLECALCIFEROL, PO Take by mouth.     Yes Historical Provider, MD  vitamin E 400 UNIT capsule Take 400 Units by mouth daily.     Yes Historical Provider, MD  Azelastine-Fluticasone [facility]) 137-50 MCG/ACT SUSP 1-2  puffs each nostril once daily at bedtime 05/31/13   Deneise Lever, MD  metoprolol succinate (TOPROL-XL) 25 MG 24 hr tablet Take 1/2 tab by mouth twice daily 08/25/13   Hendricks Limes, MD  nitrofurantoin, macrocrystal-monohydrate, (MACROBID) 100 MG capsule Take 1 capsule (100 mg total) by mouth 2 (two) times daily. 07/04/13   Milford Cage, FNP   Review of Systems  Eyes: Positive for pain and redness.      Objective:   Physical Exam  Nursing note and vitals reviewed. Constitutional: She is oriented to person, place, and time. She appears well-developed and well-nourished. No distress.  HENT:  Head: Normocephalic and atraumatic.  Eyes: EOM are normal.  Neck: Normal range of motion. Neck supple.  Cardiovascular: Normal rate.   BP-120/70  Pulmonary/Chest: Effort normal.  Musculoskeletal: Normal range of motion.  Neurological: She is alert and oriented to person, place, and time.  Skin: Skin is warm and dry.  Psychiatric: She has a normal mood and affect. Her behavior is normal.      Assessment & Plan:    Subconjunctival hemorrhage of right eye  Signed, [name], MD

## 2013-10-09 NOTE — Patient Instructions (Signed)
Subconjunctival Hemorrhage °A subconjunctival hemorrhage is a bright red patch covering a portion of the white of the eye. The white part of the eye is called the sclera, and it is covered by a thin membrane called the conjunctiva. This membrane is clear, except for tiny blood vessels that you can see with the naked eye. When your eye is irritated or inflamed and becomes red, it is because the vessels in the conjunctiva are swollen. °Sometimes, a blood vessel in the conjunctiva can break and bleed. When this occurs, the blood builds up between the conjunctiva and the sclera, and spreads out to create a red area. The red spot may be very small at first. It may then spread to cover a larger part of the surface of the eye, or even all of the visible white part of the eye. °In almost all cases, the blood will go away and the eye will become white again. Before completely dissolving, however, the red area may spread. It may also become brownish-yellow in color, before going away. If a lot of blood collects under the conjunctiva, it may look like a bulge on the surface of the eye. This looks scary, but it will also eventually flatten out and go away. Subconjunctival hemorrhages do not cause pain, but if swollen, may cause a feeling of irritation. There is no effect on vision.  °CAUSES  °· The most common cause is mild trauma (rubbing the eye, irritation). °· Subconjunctival hemorrhages can happen because of coughing or straining (lifting heavy objects), vomiting, or sneezing. °· In some cases, your doctor may want to check your blood pressure. High blood pressure can also cause a sunconjunctival hemorrhage. °· Severe trauma or blunt injuries. °· Diseases that affect blood clotting (hemophilia, leukemia). °· Abnormalities of blood vessels behind the eye (carotid cavernous sinus fistula). °· Tumors behind the eye. °· Certain drugs (aspirin, coumadin, heparin). °· Recent eye surgery. °HOME CARE INSTRUCTIONS  °· Do not worry  about the appearance of your eye. You may continue your usual activities. °· Often, follow-up is not necessary. °SEEK MEDICAL CARE IF:  °· Your eye becomes painful. °· The bleeding does not disappear within 3 weeks. °· Bleeding occurs elsewhere, for example, under the skin, in the mouth, or in the other eye. °· You have recurring subconjunctival hemorrhages. °SEEK IMMEDIATE MEDICAL CARE IF:  °· Your vision changes or you have difficulty seeing. °· You develop severe headache, persistent vomiting, confusion, or abnormal drowsiness (lethargy). °· Your eye seems to bulge or protrude from the eye socket. °· You notice the sudden appearance of bruises, or have spontaneous bleeding elsewhere on your body. °Document Released: 05/26/2005 Document Revised: 08/18/2011 Document Reviewed: 04/23/2009 °Exit[employer]® Patient Information ©2014 [employer]. ° °

## 2013-10-27 ENCOUNTER — Ambulatory Visit (INDEPENDENT_AMBULATORY_CARE_PROVIDER_SITE_OTHER): Payer: BC Managed Care – PPO | Admitting: Internal Medicine

## 2013-10-27 ENCOUNTER — Encounter: Payer: Self-pay | Admitting: Internal Medicine

## 2013-10-27 VITALS — BP 124/66 | HR 71 | Ht 60.0 in | Wt 125.4 lb

## 2013-10-27 DIAGNOSIS — J410 Simple chronic bronchitis: Secondary | ICD-10-CM

## 2013-10-27 DIAGNOSIS — J209 Acute bronchitis, unspecified: Secondary | ICD-10-CM

## 2013-10-27 MED ORDER — BENZONATATE 200 MG PO CAPS: 200.0000 mg | ORAL_CAPSULE | Freq: Three times a day (TID) | ORAL | Status: AC | PRN

## 2013-10-27 MED ORDER — HYDROCODONE-HOMATROPINE 5-1.5 MG/5ML PO SYRP: 5.0000 mL | ORAL_SOLUTION | Freq: Four times a day (QID) | ORAL | Status: DC | PRN

## 2013-10-27 MED ORDER — AZITHROMYCIN 250 MG PO TABS: ORAL_TABLET | ORAL | Status: DC

## 2013-10-27 NOTE — Progress Notes (Signed)
03/15/13- 73 yoF former smoker referred courtesy of Dr Megan Downs; chronic cough and drainage. Bothersome chronic cough, present for years, worse lying down or with eating. Smoked one pack per day for only a few years, ending in 65. Had flu vaccine. Describes perennial head congestion with mucus and drainage, cough occasionally productive with episodes of bronchitis. Sputum mostly white. Mild asthma with occasional wheeze. Breathing does not wake her. Symbicort no help. Bothersome postnasal drip. Remote pneumonia. Chest x-ray 2013 question middle lobe density. Severe scoliosis-swims and does yoga. Grew up in a smoking family. X line history of reflux worse with acid foods. Remote history of hives associated with exercise. CXR 09/03/11 IMPRESSION:  1. No acute disease.  2. Findings compatible with emphysema.  3. Marked scoliosis.  Original Report Authenticated By: Arvid Right. D'ALESSIO, M.D.  05/16/13-62 yoF former smoker referred courtesy of Dr Megan Downs; chronic rhinitis and bronchitis, complicated by scoliosis FOLLOWS FOR: feels like allergies have gotten worse since last visit; states she is having alot of drainage from nasal area to throat-sometimes can get mucus up and its brown in color. Still persistent head congestion and sneezing. Scant nasal discharge. Persistent nonproductive cough Allergy Profile 03/15/2013-total IgE 11.6 with elevations only for ragweed. Spiriva made cough worse. Dymista nasal spray was a big help and also reduced cough. CXR 03/17/13 IMPRESSION:  No active cardiopulmonary disease. Chronic findings as described  above.  Electronically Signed  By: Sabino Dick M.D.  On: 03/15/2013 16:29  10/27/13- 89 yoF former smoker referred courtesy of Dr Megan Downs; chronic rhinitis and bronchitis, complicated by scoliosis ACUTE VISIT: stuffy nose, deep dry cough; has recently been around sick grandkids -they had strep and deep cough.3 weeks ago visit in [place]. Had some cough  but did not feel sick. When exposed to sick grandchildren then 4 days later had increased coughing. Yesterday started rhinorrhea. Never had fever, sore throat, swollen glands, myalgias or GI upset.  ROS-see HPI Constitutional:   No-   weight loss, night sweats, fevers, chills, fatigue, lassitude. HEENT:   No-  headaches, difficulty swallowing, tooth/dental problems, sore throat,       No-  sneezing, itching, ear ache, nasal congestion, +post nasal drip,  CV:  No-   chest pain, orthopnea, PND, swelling in lower extremities, anasarca, dizziness, palpitations Resp: + shortness of breath with exertion or at rest.              No-  productive cough, + non-productive cough,  No- coughing up of blood.              No-   change in color of mucus.  No- wheezing.   Skin: No-   rash or lesions. GI:  + heartburn, indigestion, no-abdominal pain, nausea, vomiting,  GU:  MS:  No-   joint pain or swelling. .  + back pain. Neuro-     nothing unusual Psych:  No- change in mood or affect. No depression or anxiety.  No memory loss.  OBJ- Physical Exam General- Alert, Oriented, Affect-appropriate, Distress- none acute Skin- rash-none, lesions- none, excoriation- none Lymphadenopathy- none Head- atraumatic            Eyes- Gross vision intact, PERRLA, conjunctivae and secretions clear            Ears- Hearing, canals-normal            Nose- +sniffing, +Septal dev, no-mucus, polyps, erosion, perforation             Throat- Mallampati II ,  mucosa clear , drainage- none, tonsils- atrophic Neck- flexible , trachea midline, no stridor , thyroid nl, carotid no bruit Chest - symmetrical excursion , unlabored           Heart/CV- RRR , no murmur , no gallop  , no rub, nl s1 s2                           - JVD- none , edema- none, stasis changes- none, varices- none           Lung- clear to P&A, wheeze- none, cough+ deep hacking , dullness-none, rub- none           Chest wall- scoliosis Abd-  Br/ Gen/ Rectal- Not  done, not indicated Extrem- cyanosis- none, clubbing, none, atrophy- none, strength- nl Neuro- grossly intact to observation

## 2013-10-27 NOTE — Patient Instructions (Signed)
Neb Xop 0.63   Depo 80  Script sent for [employer] for Cough syrup  Script for [employer]

## 2013-10-28 MED ORDER — METHYLPREDNISOLONE ACETATE 80 MG/ML IJ SUSP
80.0000 mg | Freq: Once | INTRAMUSCULAR | Status: AC
  Administered 2013-10-28: 80 mg via INTRAMUSCULAR

## 2013-10-28 MED ORDER — LEVALBUTEROL HCL 0.63 MG/3ML IN NEBU
0.6300 mg | INHALATION_SOLUTION | Freq: Once | RESPIRATORY_TRACT | Status: AC
  Administered 2013-10-28: 0.63 mg via RESPIRATORY_TRACT

## 2013-11-14 ENCOUNTER — Ambulatory Visit: Payer: BC Managed Care – PPO | Admitting: Internal Medicine

## 2013-11-26 ENCOUNTER — Ambulatory Visit (INDEPENDENT_AMBULATORY_CARE_PROVIDER_SITE_OTHER): Payer: BC Managed Care – PPO | Admitting: Internal Medicine

## 2013-11-26 ENCOUNTER — Encounter: Payer: Self-pay | Admitting: Internal Medicine

## 2013-11-26 VITALS — BP 130/90 | HR 71 | Temp 98.3°F | Resp 18

## 2013-11-26 DIAGNOSIS — R059 Cough, unspecified: Secondary | ICD-10-CM

## 2013-11-26 DIAGNOSIS — T63461A Toxic effect of venom of wasps, accidental (unintentional), initial encounter: Secondary | ICD-10-CM

## 2013-11-26 DIAGNOSIS — L509 Urticaria, unspecified: Secondary | ICD-10-CM

## 2013-11-26 DIAGNOSIS — T782XXA Anaphylactic shock, unspecified, initial encounter: Secondary | ICD-10-CM

## 2013-11-26 DIAGNOSIS — Z87892 Personal history of anaphylaxis: Secondary | ICD-10-CM

## 2013-11-26 DIAGNOSIS — R05 Cough: Secondary | ICD-10-CM | POA: Insufficient documentation

## 2013-11-26 DIAGNOSIS — T6391XA Toxic effect of contact with unspecified venomous animal, accidental (unintentional), initial encounter: Secondary | ICD-10-CM

## 2013-11-26 DIAGNOSIS — T63441A Toxic effect of venom of bees, accidental (unintentional), initial encounter: Secondary | ICD-10-CM

## 2013-11-26 MED ORDER — EPINEPHRINE 0.3 MG/0.3ML IJ SOAJ: 0.3000 mg | Freq: Once | INTRAMUSCULAR | Status: DC

## 2013-11-26 MED ORDER — DIPHENHYDRAMINE HCL 25 MG PO CAPS
50.0000 mg | ORAL_CAPSULE | Freq: Once | ORAL | Status: AC
Start: 2013-11-26 — End: 2013-11-26
  Administered 2013-11-26: 50 mg via ORAL

## 2013-11-26 MED ORDER — RANITIDINE HCL 150 MG PO TABS
150.0000 mg | ORAL_TABLET | Freq: Once | ORAL | Status: AC
  Administered 2013-11-26: 150 mg via ORAL

## 2013-11-26 MED ORDER — DIPHENHYDRAMINE HCL 50 MG PO TABS: 50.0000 mg | ORAL_TABLET | Freq: Three times a day (TID) | ORAL | Status: AC | PRN

## 2013-11-26 MED ORDER — CETIRIZINE HCL 10 MG PO TABS
10.0000 mg | ORAL_TABLET | Freq: Once | ORAL | Status: AC
  Administered 2013-11-26: 10 mg via ORAL

## 2013-11-26 MED ORDER — EPINEPHRINE HCL 1 MG/ML IJ SOLN
0.3000 mg | Freq: Once | INTRAMUSCULAR | Status: AC
  Administered 2013-11-26: 0.3 mg via INTRAMUSCULAR

## 2013-11-26 MED ORDER — CETIRIZINE HCL 10 MG PO TABS: 10.0000 mg | ORAL_TABLET | Freq: Every day | ORAL | Status: DC

## 2013-11-26 MED ORDER — RANITIDINE HCL 150 MG PO TABS: 150.0000 mg | ORAL_TABLET | Freq: Two times a day (BID) | ORAL | Status: DC

## 2013-11-26 MED ORDER — PREDNISONE 10 MG PO TABS: ORAL_TABLET | ORAL | Status: DC

## 2013-11-26 MED ORDER — METHYLPREDNISOLONE SODIUM SUCC 125 MG IJ SOLR
125.0000 mg | Freq: Once | INTRAMUSCULAR | Status: AC
  Administered 2013-11-26: 125 mg via INTRAVENOUS

## 2013-11-26 NOTE — Progress Notes (Signed)
   Subjective:    Patient ID: Megan Downs, female    DOB: 1951-05-28, 62 y.o.   MRN: [id number]  HPI Emergency, bee sting with hives, dizzy, hx of anaphylaxix. Her epi pen had expired. Anxious in distress, emergency protocol initiated.VS monitoring. Benedryl 50mg  po Epi .3cc IM stat IV fluids started Solumedrol 125mg  IV given Cetirizine 10mg  po Zantac 300mg  po   Repeat vs normal  Further hx, has allergys and chronic cough, Dr. Annamaria Boots pulmonary main doctor.  No other chronic diseases or meds   Review of Systems     Objective:   Physical Exam  Vitals reviewed. Constitutional: She is oriented to person, place, and time. She appears well-developed and well-nourished. She appears distressed.  Eyes: EOM are normal. No scleral icterus.  Neck: Normal range of motion. Neck supple.  Cardiovascular: Normal rate, regular rhythm and normal heart sounds.   Pulmonary/Chest: Effort normal and breath sounds normal. No respiratory distress. She has no wheezes. She exhibits no tenderness.  Neurological: She is alert and oriented to person, place, and time. She exhibits normal muscle tone. Coordination normal.  Skin: Skin is intact. Rash noted. Rash is urticarial.  Hives all over every quadrant  Psychiatric: Judgment and thought content normal. Her mood appears anxious. Her speech is rapid and/or pressured. She is agitated. Cognition and memory are normal.    Observed over 1 hour  90% of hives and itching resolved Discussed going to er to be observed or home.Husband very attentive and she very astute prefers home. Understands to go to ER.        Assessment & Plan:  Hives/Hymenoptra sting/Possible anaphlaxis early tx  Home med: epipen,benadryl, cetirizine,zantac, and prednisone taper Recheck tomorrow/prn ER

## 2013-11-26 NOTE — Patient Instructions (Addendum)
Epinephrine Injection Epinephrine is a medicine given by injection to temporarily treat an emergency allergic reaction. It is also used to treat severe asthmatic attacks and other lung problems. The medicine helps to enlarge (dilate) the small breathing tubes of the lungs. A life-threatening, sudden allergic reaction that involves the whole body is called anaphylaxis. Because of potential side effects, epinephrine should only be used as directed by your caregiver. RISKS AND COMPLICATIONS Possible side effects of epinephrine injections include:  Chest pain.  Irregular or rapid heartbeat.  Shortness of breath.  Nausea.  Vomiting.  Abdominal pain or cramping.  Sweating.  Dizziness.  Weakness.  Headache.  Nervousness. Report all side effects to your caregiver. HOW TO GIVE AN EPINEPHRINE INJECTION Give the epinephrine injection immediately when symptoms of a severe reaction begin. Inject the medicine into the outer thigh or any available, large muscle. Your caregiver can teach you how to do this. You do not need to remove any clothing. After the injection, call your local emergency services (911 in [place] Even if you improve after the injection, you need to be examined at a hospital emergency department. Epinephrine works quickly, but it also wears off quickly. Delayed reactions can occur. A delayed reaction may be as serious and dangerous as the initial reaction. HOME CARE INSTRUCTIONS  Make sure you and your family know how to give an epinephrine injection.  Use epinephrine injections as directed by your caregiver. Do not use this medicine more often or in larger doses than prescribed.  Always carry your epinephrine injection or anaphylaxis kit with you. This can be lifesaving if you have a severe reaction.  Store the medicine in a cool, dry place. If the medicine becomes discolored or cloudy, dispose of it properly and replace it with new medicine.  Check the expiration date on  your medicine. It may be unsafe to use medicines past their expiration date.  Tell your caregiver about any other medicines you are taking. Some medicines can react badly with epinephrine.  Tell your caregiver about any medical conditions you have, such as diabetes, high blood pressure (hypertension), heart disease, irregular heartbeats, or if you are pregnant. SEEK IMMEDIATE MEDICAL CARE IF:  You have used an epinephrine injection. Call your local emergency services (911 in [place] Even if you improve after the injection, you need to be examined at a hospital emergency department to make sure your allergic reaction is under control. You will also be monitored for adverse effects from the medicine.  You have chest pain.  You have irregular or fast heartbeats.  You have shortness of breath.  You have severe headaches.  You have severe nausea, vomiting, or abdominal cramps.  You have severe pain, swelling, or redness in the area where you gave the injection. Document Released: 05/23/2000 Document Revised: 08/18/2011 Document Reviewed: 02/12/2011 [facility] Patient Information 2015 [place], [place]. This information is not intended to replace advice given to you by your health care provider. Make sure you discuss any questions you have with your health care provider. Anaphylactic Reaction An anaphylactic reaction is a sudden, severe allergic reaction that involves the whole body. It can be life threatening. A hospital stay is often required. People with asthma, eczema, or hay fever are slightly more likely to have an anaphylactic reaction. CAUSES  An anaphylactic reaction may be caused by anything to which you are allergic. After being exposed to the allergic substance, your immune system becomes sensitized to it. When you are exposed to that allergic substance again, an  allergic reaction can occur. Common causes of an anaphylactic reaction include:  Medicines.  Foods, especially peanuts,  wheat, shellfish, milk, and eggs.  Insect bites or stings.  Blood products.  Chemicals, such as dyes, latex, and contrast material used for imaging tests. SYMPTOMS  When an allergic reaction occurs, the body releases histamine and other substances. These substances cause symptoms such as tightening of the airway. Symptoms often develop within seconds or minutes of exposure. Symptoms may include:  Skin rash or hives.  Itching.  Chest tightness.  Swelling of the eyes, tongue, or lips.  Trouble breathing or swallowing.  Lightheadedness or fainting.  Anxiety or confusion.  Stomach pains, vomiting, or diarrhea.  Nasal congestion.  A fast or irregular heartbeat (palpitations). DIAGNOSIS  Diagnosis is based on your history of recent exposure to allergic substances, your symptoms, and a physical exam. Your caregiver may also perform blood or urine tests to confirm the diagnosis. TREATMENT  Epinephrine medicine is the main treatment for an anaphylactic reaction. Other medicines that may be used for treatment include antihistamines, steroids, and albuterol. In severe cases, fluids and medicine to support blood pressure may be given through an intravenous line (IV). Even if you improve after treatment, you need to be observed to make sure your condition does not get worse. This may require a stay in the hospital. Dodge a medical alert bracelet or necklace stating your allergy.  You and your family must learn how to use an anaphylaxis kit or give an epinephrine injection to temporarily treat an emergency allergic reaction. Always carry your epinephrine injection or anaphylaxis kit with you. This can be lifesaving if you have a severe reaction.  Do not drive or perform tasks after treatment until the medicines used to treat your reaction have worn off, or until your caregiver says it is okay.  If you have hives or a rash:  Take medicines as directed by your  caregiver.  You may use an over-the-counter antihistamine (diphenhydramine) as needed.  Apply cold compresses to the skin or take baths in cool water. Avoid hot baths or showers. SEEK MEDICAL CARE IF:   You develop symptoms of an allergic reaction to a new substance. Symptoms may start right away or minutes later.  You develop a rash, hives, or itching.  You develop new symptoms. SEEK IMMEDIATE MEDICAL CARE IF:   You have swelling of the mouth, difficulty breathing, or wheezing.  You have a tight feeling in your chest or throat.  You develop hives, swelling, or itching all over your body.  You develop severe vomiting or diarrhea.  You feel faint or pass out. This is an emergency. Use your epinephrine injection or anaphylaxis kit as you have been instructed. Call your local emergency services (911 in [place] Even if you improve after the injection, you need to be examined at a hospital emergency department. MAKE SURE YOU:   Understand these instructions.  Will watch your condition.  Will get help right away if you are not doing well or get worse. Document Released: 05/26/2005 Document Revised: 05/31/2013 Document Reviewed: 08/27/2011 [facility] Patient Information 2015 [place], [place]. This information is not intended to replace advice given to you by your health care provider. Make sure you discuss any questions you have with your health care provider. Bee, Wasp, or Hornet Sting Your caregiver has diagnosed you as having an insect sting. An insect sting appears as a red lump in the skin that sometimes has a tiny hole  in the center, or it may have a stinger in the center of the wound. The most common stings are from wasps, hornets and bees. Individuals have different reactions to insect stings. A normal reaction may cause pain, swelling, and redness around the sting site. A localized allergic reaction may cause swelling and redness that extends beyond the sting site. A large local  reaction may continue to develop over the next 12 to 36 hours. On occasion, the reactions can be severe (anaphylactic reaction). An anaphylactic reaction may cause wheezing; difficulty breathing; chest pain; fainting; raised, itchy, red patches on the skin; a sick feeling to your stomach (nausea); vomiting; cramping; or diarrhea. If you have had an anaphylactic reaction to an insect sting in the past, you are more likely to have one again. HOME CARE INSTRUCTIONS  With bee stings, a small sac of poison is left in the wound. Brushing across this with something such as a credit card, or anything similar, will help remove this and decrease the amount of the reaction. This same procedure will not help a wasp sting as they do not leave behind a stinger and poison sac. Apply a cold compress for 10 to 20 minutes every hour for 1 to 2 days, depending on severity, to reduce swelling and itching. To lessen pain, a paste made of water and baking soda may be rubbed on the bite or sting and left on for 5 minutes. To relieve itching and swelling, you may use take medication or apply medicated creams or lotions as directed. Only take over-the-counter or prescription medicines for pain, discomfort, or fever as directed by your caregiver. Wash the sting site daily with soap and water. Apply antibiotic ointment on the sting site as directed. If you suffered a severe reaction: If you did not require hospitalization, an adult will need to stay with you for 24 hours in case the symptoms return. You may need to wear a medical bracelet or necklace stating the allergy. You and your family need to learn when and how to use an anaphylaxis kit or epinephrine injection. If you have had a severe reaction before, always carry your anaphylaxis kit with you. SEEK MEDICAL CARE IF:  None of the above helps within 2 to 3 days. The area becomes red, warm, tender, and swollen beyond the area of the bite or sting. You have an oral  temperature above 102 F (38.9 C). SEEK IMMEDIATE MEDICAL CARE IF:  You have symptoms of an allergic reaction which are: Wheezing. Difficulty breathing. Chest pain. Lightheadedness or fainting. Itchy, raised, red patches on the skin. Nausea, vomiting, cramping or diarrhea. ANY OF THESE SYMPTOMS MAY REPRESENT A SERIOUS PROBLEM THAT IS AN EMERGENCY. Do not wait to see if the symptoms will go away. Get medical help right away. Call your local emergency services (911 in [place] DO NOT drive yourself to the hospital. MAKE SURE YOU:  Understand these instructions. Will watch your condition. Will get help right away if you are not doing well or get worse. Document Released: 05/26/2005 Document Revised: 08/18/2011 Document Reviewed: 11/10/2009 [facility] Patient Information 2015 [place], [place]. This information is not intended to replace advice given to you by your health care provider. Make sure you discuss any questions you have with your health care provider.

## 2013-12-12 NOTE — Assessment & Plan Note (Signed)
Acute illness visit associated with trip to [facility] but was improving until she was exposed to sick grandchildren. Most likely this is a simple viral upper respiratory infection with bronchitis. Plan-nebulizer treatment with Xopenex, Depo-Medrol, Z-Pak, cough syrup and Tessalon

## 2014-01-26 ENCOUNTER — Encounter: Payer: Self-pay | Admitting: Internal Medicine

## 2014-03-06 ENCOUNTER — Telehealth: Payer: Self-pay | Admitting: Internal Medicine

## 2014-03-06 NOTE — Telephone Encounter (Signed)
We prefer to separate these shots so they work better. Suggest she go ahead and get flu vaccine now. We can then give the Prevnar vaccine when she comes for her next ov.

## 2014-03-06 NOTE — Telephone Encounter (Signed)
Spoke with patient Has upcoming appt with [facility] 04/26/14 at 915 Would like to know if she can have Prevnar at this visit coming up. Pt also requesting Flu shot-- would like to know if she should wait until OV coming up or go ahead and get it.   Please advise Dr Annamaria Boots. thanks

## 2014-03-06 NOTE — Telephone Encounter (Signed)
lmomtcb x1 

## 2014-03-06 NOTE — Telephone Encounter (Signed)
Called pt. Aware of [facility] rcs. She voiced her understanding and needed nothing further

## 2014-03-24 ENCOUNTER — Telehealth: Payer: Self-pay | Admitting: Internal Medicine

## 2014-03-24 MED ORDER — AZITHROMYCIN 250 MG PO TABS
ORAL_TABLET | ORAL | Status: DC
Start: 2014-03-24 — End: 2014-04-26

## 2014-03-24 NOTE — Telephone Encounter (Signed)
Called and spoke with pt and she is aware of CY recs and the zpak has been sent to the pharmacy.  Nothing further is needed.

## 2014-03-24 NOTE — Telephone Encounter (Signed)
Z pak

## 2014-03-24 NOTE — Telephone Encounter (Signed)
Called spoke with patient who c/o prod cough with green mucus x1 week, occasional wheezing and dyspnea, some head congestion w/ PND.  Denies f/c/s, n/v/d, hemoptysis.  [facility] [address].  Dr Annamaria Boots please advise, thank you. Allergies  Allergen Reactions  . Bee Venom    Current Outpatient Prescriptions on File Prior to Visit  Medication Sig Dispense Refill  . aspirin 81 MG tablet Take 81 mg by mouth daily.        Marland Kitchen azithromycin (ZITHROMAX) 250 MG tablet 2 today then one daily  6 tablet  0  . CALCIUM CARBONATE PO Take by mouth daily.        . cetirizine (ZYRTEC) 10 MG tablet Take 1 tablet (10 mg total) by mouth daily.  30 tablet  11  . diphenhydrAMINE (BENADRYL) 50 MG tablet Take 1 tablet (50 mg total) by mouth every 8 (eight) hours as needed for itching or allergies.  30 tablet  1  . EPINEPHrine (EPIPEN) 0.3 mg/0.3 mL IJ SOAJ injection Inject 0.3 mLs (0.3 mg total) into the muscle once.  2 Device  2  . HYDROcodone-homatropine (HYDROMET) 5-1.5 MG/5ML syrup Take 5 mLs by mouth every 6 (six) hours as needed for cough.  120 mL  0  . MAGNESIUM PO Take by mouth daily.        . Omega-3 Fatty Acids (OMEGA 3 PO) Take by mouth daily.        . predniSONE (DELTASONE) 10 MG tablet 6-5-4-3-2-1 po pc for allergic reaction  21 tablet  0  . ranitidine (ZANTAC) 150 MG tablet Take 1 tablet (150 mg total) by mouth 2 (two) times daily.  60 tablet  1  . VITAMIN D, CHOLECALCIFEROL, PO Take by mouth.        . vitamin E 400 UNIT capsule Take 400 Units by mouth daily.         No current facility-administered medications on file prior to visit.

## 2014-04-10 ENCOUNTER — Encounter: Payer: Self-pay | Admitting: Internal Medicine

## 2014-04-26 ENCOUNTER — Encounter: Payer: Self-pay | Admitting: Internal Medicine

## 2014-04-26 ENCOUNTER — Ambulatory Visit (INDEPENDENT_AMBULATORY_CARE_PROVIDER_SITE_OTHER): Payer: BC Managed Care – PPO | Admitting: Internal Medicine

## 2014-04-26 ENCOUNTER — Ambulatory Visit (INDEPENDENT_AMBULATORY_CARE_PROVIDER_SITE_OTHER)
Admission: RE | Admit: 2014-04-26 | Discharge: 2014-04-26 | Disposition: A | Discharge status: Home / Self Care | Payer: BC Managed Care – PPO | Source: Ambulatory Visit | Attending: Internal Medicine | Admitting: Internal Medicine

## 2014-04-26 VITALS — BP 122/70 | HR 66 | Ht 60.0 in | Wt 126.6 lb

## 2014-04-26 DIAGNOSIS — J449 Chronic obstructive pulmonary disease, unspecified: Secondary | ICD-10-CM

## 2014-04-26 DIAGNOSIS — J4489 Other specified chronic obstructive pulmonary disease: Secondary | ICD-10-CM

## 2014-04-26 DIAGNOSIS — R05 Cough: Secondary | ICD-10-CM

## 2014-04-26 DIAGNOSIS — R059 Cough, unspecified: Secondary | ICD-10-CM

## 2014-04-26 DIAGNOSIS — L509 Urticaria, unspecified: Secondary | ICD-10-CM

## 2014-04-26 DIAGNOSIS — T782XXA Anaphylactic shock, unspecified, initial encounter: Secondary | ICD-10-CM

## 2014-04-26 DIAGNOSIS — J410 Simple chronic bronchitis: Secondary | ICD-10-CM

## 2014-04-26 DIAGNOSIS — J310 Chronic rhinitis: Secondary | ICD-10-CM

## 2014-04-26 DIAGNOSIS — T63441A Toxic effect of venom of bees, accidental (unintentional), initial encounter: Secondary | ICD-10-CM

## 2014-04-26 MED ORDER — CETIRIZINE HCL 10 MG PO TABS: 10.0000 mg | ORAL_TABLET | Freq: Every day | ORAL | Status: DC

## 2014-04-26 MED ORDER — AZELASTINE-FLUTICASONE 137-50 MCG/ACT NA SUSP: 1.0000 | Freq: Every day | NASAL | Status: DC

## 2014-04-26 NOTE — Patient Instructions (Signed)
Refill scripts sent for Zyrtec and Dymista  Order CXR- dx chronic bronchitis without exacerbation, chronic cough  Consider talking to an allergist at the [facility] on [address] about bee venom allergy

## 2014-04-26 NOTE — Progress Notes (Signed)
03/15/13- 57 yoF former smoker referred courtesy of Dr Linna Darner; chronic cough and drainage. Bothersome chronic cough, present for years, worse lying down or with eating. Smoked one pack per day for only a few years, ending in 72. Had flu vaccine. Describes perennial head congestion with mucus and drainage, cough occasionally productive with episodes of bronchitis. Sputum mostly white. Mild asthma with occasional wheeze. Breathing does not wake her. Symbicort no help. Bothersome postnasal drip. Remote pneumonia. Chest x-ray 2013 question middle lobe density. Severe scoliosis-swims and does yoga. Grew up in a smoking family. X line history of reflux worse with acid foods. Remote history of hives associated with exercise. CXR 09/03/11 IMPRESSION:  1. No acute disease.  2. Findings compatible with emphysema.  3. Marked scoliosis.  Original Report Authenticated By: Arvid Right. D'ALESSIO, M.D.  05/16/13-62 yoF former smoker referred courtesy of Dr Linna Darner; chronic rhinitis and bronchitis, complicated by scoliosis FOLLOWS FOR: feels like allergies have gotten worse since last visit; states she is having alot of drainage from nasal area to throat-sometimes can get mucus up and its brown in color. Still persistent head congestion and sneezing. Scant nasal discharge. Persistent nonproductive cough Allergy Profile 03/15/2013-total IgE 11.6 with elevations only for ragweed. Spiriva made cough worse. Dymista nasal spray was a big help and also reduced cough. CXR 03/17/13 IMPRESSION:  No active cardiopulmonary disease. Chronic findings as described  above.  Electronically Signed  By: Sabino Dick M.D.  On: 03/15/2013 16:29  10/27/13- 37 yoF former smoker referred courtesy of Dr Linna Darner; chronic rhinitis and bronchitis, complicated by scoliosis ACUTE VISIT: stuffy nose, deep dry cough; has recently been around sick grandkids -they had strep and deep cough.3 weeks ago visit in [place]. Had some cough  but did not feel sick. When exposed to sick grandchildren then 4 days later had increased coughing. Yesterday started rhinorrhea. Never had fever, sore throat, swollen glands, myalgias or GI upset.  04/26/14- 63 yoF former smoker followed for chronic rhinitis and bronchitis, complicated by scoliosis, hx bee sting anaphylaxis/ Epipen FOLLOWS SFK:CLEXNTZGY to have cough(pt states this is normal for her) and gets better through out the day. Has restarted Zyrtec which helps cough. Bad bronchitis episode in October responded to Z pak. Discussed hx bee sting/ YJ. Has Epipen.   ROS-see HPI Constitutional:   No-   weight loss, night sweats, fevers, chills, fatigue, lassitude. HEENT:   No-  headaches, difficulty swallowing, tooth/dental problems, sore throat,       No-  sneezing, itching, ear ache, nasal congestion, +post nasal drip,  CV:  No-   chest pain, orthopnea, PND, swelling in lower extremities, anasarca, dizziness, palpitations Resp: + shortness of breath with exertion or at rest.              No-  productive cough, + non-productive cough,  No- coughing up of blood.              No-   change in color of mucus.  No- wheezing.   Skin: No-   rash or lesions. GI:  + heartburn, indigestion, no-abdominal pain, nausea, vomiting,  GU:  MS:  No-   joint pain or swelling. .  + back pain. Neuro-     nothing unusual Psych:  No- change in mood or affect. No depression or anxiety.  No memory loss.  OBJ- Physical Exam General- Alert, Oriented, Affect-appropriate, Distress- none acute Skin- rash-none, lesions- none, excoriation- none Lymphadenopathy- none Head- atraumatic  Eyes- Gross vision intact, PERRLA, conjunctivae and secretions clear            Ears- Hearing, canals-normal            Nose- +sniffing, +Septal dev, no-mucus, polyps, erosion, perforation             Throat- Mallampati II , mucosa clear , drainage- none, tonsils- atrophic Neck- flexible , trachea midline, no stridor ,  thyroid nl, carotid no bruit Chest - symmetrical excursion , unlabored           Heart/CV- RRR , no murmur , no gallop  , no rub, nl s1 s2                           - JVD- none , edema- none, stasis changes- none, varices- none           Lung- clear to P&A, wheeze- none, cough+ light , dullness-none, rub- none           Chest wall- +scoliosis Abd-  Br/ Gen/ Rectal- Not done, not indicated Extrem- cyanosis- none, clubbing, none, atrophy- none, strength- nl Neuro- grossly intact to observation

## 2014-04-30 NOTE — Assessment & Plan Note (Signed)
Discussed management, including ability to refer her for venom testing which we don't do here.

## 2014-04-30 NOTE — Assessment & Plan Note (Signed)
Discussed options. Plan- continue Zyrtec and Dymista as instructed

## 2014-04-30 NOTE — Assessment & Plan Note (Signed)
Discussed non-allergic cough mechanisms. Plan- reflux precautions

## 2014-04-30 NOTE — Assessment & Plan Note (Signed)
Manage routine reflux precautions, Dymista for postnasal drip. Consider bronchodilator Plan- CXR

## 2014-05-02 ENCOUNTER — Telehealth: Payer: Self-pay | Admitting: Internal Medicine

## 2014-05-02 DIAGNOSIS — R05 Cough: Secondary | ICD-10-CM

## 2014-05-02 DIAGNOSIS — R059 Cough, unspecified: Secondary | ICD-10-CM

## 2014-05-02 DIAGNOSIS — T63441A Toxic effect of venom of bees, accidental (unintentional), initial encounter: Secondary | ICD-10-CM

## 2014-05-02 DIAGNOSIS — L509 Urticaria, unspecified: Secondary | ICD-10-CM

## 2014-05-02 DIAGNOSIS — T782XXA Anaphylactic shock, unspecified, initial encounter: Secondary | ICD-10-CM

## 2014-05-02 MED ORDER — CETIRIZINE HCL 10 MG PO TABS: 10.0000 mg | ORAL_TABLET | Freq: Every day | ORAL | Status: DC

## 2014-05-02 MED ORDER — AZELASTINE-FLUTICASONE 137-50 MCG/ACT NA SUSP: 1.0000 | Freq: Every day | NASAL | Status: DC

## 2014-05-02 NOTE — Telephone Encounter (Signed)
Spoke with pt. States that the pharmacy does not have the rx's CY sent in on 04/26/14. Rx's have been resent. Nothing further was needed.

## 2014-06-29 ENCOUNTER — Encounter: Payer: Self-pay | Admitting: Nurse Practitioner

## 2014-06-29 ENCOUNTER — Ambulatory Visit (INDEPENDENT_AMBULATORY_CARE_PROVIDER_SITE_OTHER): Payer: Medicare Other | Admitting: Nurse Practitioner

## 2014-06-29 VITALS — BP 116/62 | HR 60 | Ht 59.5 in | Wt 127.0 lb

## 2014-06-29 DIAGNOSIS — Z01419 Encounter for gynecological examination (general) (routine) without abnormal findings: Secondary | ICD-10-CM | POA: Diagnosis not present

## 2014-06-29 DIAGNOSIS — Z124 Encounter for screening for malignant neoplasm of cervix: Secondary | ICD-10-CM

## 2014-06-29 NOTE — Patient Instructions (Signed)

## 2014-06-29 NOTE — Progress Notes (Signed)
Patient ID: Megan Downs, female   DOB: 1950/06/10, 64 y.o.   MRN: [id number] 64 y.o. [id number] Married  Caucasian Fe here for annual exam.  Considering moving and down sizing.  Patient's last menstrual period was 06/09/2010.  Secondary to endo polyp        Sexually active: Yes.    The current method of family planning is post menopausal status at age 59.   Exercising: Yes.    swimming and yoga Smoker:  former  Health Maintenance: Pap:  06/22/12, negative with neg HR HPV MMG:  02/2014, 3D, Bi-Rads 2:  Benign findings Colonoscopy:  12/2009, negative, repeat in 5 years BMD:   04/15/13, T-Score Rad -2.3/Left hip -1.7/Right hip -2.1, spine not calculated due to scoliosis TDaP:  09/18/09 Shingles: 04/11/13 Labs:  PCP takes care of all labs   reports that she quit smoking about 42 years ago. Her smoking use included Cigarettes. She has a 2 pack-year smoking history. She has never used smokeless tobacco. She reports that she drinks about 1.8 oz of alcohol per week. She reports that she does not use illicit drugs.  Past Medical History  Diagnosis Date  . Hyperlipidemia   . MVP (mitral valve prolapse)     Dr Leonia Reeves ,Cardiology  stated no SBE prophylaxis  . Medullary sponge kidney   . Angiolipoma of kidney     presentation as microscopic hematuria, Dr Janice Norrie  . Allergy     Environmental   . Hypertension   . Osteopenia     Past Surgical History  Procedure Laterality Date  . Tonsillectomy    . Toe surgery Right 2009    great toe, cleaning joint  . Cystoscopy  2004     for anormal kineys;Dr Nesi  . Hysteroscopy with resectoscope  2012    eval secondary to PMB with path benign    Current Outpatient Prescriptions  Medication Sig Dispense Refill  . aspirin 81 MG tablet Take 81 mg by mouth daily.      . Azelastine-Fluticasone (DYMISTA) 137-50 MCG/ACT SUSP Place 1-2 puffs into the nose at bedtime. 1 Bottle prn  . CALCIUM CARBONATE PO Take by mouth daily.      . cetirizine (ZYRTEC) 10 MG  tablet Take 1 tablet (10 mg total) by mouth daily. 30 tablet 11  . diphenhydrAMINE (BENADRYL) 50 MG tablet Take 1 tablet (50 mg total) by mouth every 8 (eight) hours as needed for itching or allergies. 30 tablet 1  . EPINEPHrine (EPIPEN) 0.3 mg/0.3 mL IJ SOAJ injection Inject 0.3 mLs (0.3 mg total) into the muscle once. 2 Device 2  . MAGNESIUM PO Take by mouth daily.      . Omega-3 Fatty Acids (OMEGA 3 PO) Take by mouth daily.      Marland Kitchen VITAMIN D, CHOLECALCIFEROL, PO Take by mouth.      . vitamin E 400 UNIT capsule Take 400 Units by mouth daily.       No current facility-administered medications for this visit.    Family History  Problem Relation Age of Onset  . Breast cancer Mother   . Cancer Father     esophageal cancer  . Prostate cancer Brother   . Lung cancer Brother     2 brothers  . Heart attack Maternal Grandfather     in 29s  . Heart attack Maternal Grandmother     in 63s  . Heart failure Maternal Grandmother 76  . Kidney failure Paternal Grandmother   . Heart failure Paternal  Grandfather     in 65s  . Asthma Neg Hx   . COPD Neg Hx   . Diabetes Neg Hx     ROS:  Pertinent items are noted in HPI.  Otherwise, a comprehensive ROS was negative.  Exam:   BP 116/62 mmHg  Pulse 60  Ht 4' 11.5" (1.511 m)  Wt 127 lb (57.607 kg)  BMI 25.23 kg/m2  LMP 06/09/2010 Height: 4' 11.5" (151.1 cm) Ht Readings from Last 3 Encounters:  06/29/14 4' 11.5" (1.511 m)  04/26/14 5' (1.524 m)  10/27/13 5' (1.524 m)    General appearance: alert, cooperative and appears stated age Head: Normocephalic, without obvious abnormality, atraumatic Neck: no adenopathy, supple, symmetrical, trachea midline and thyroid normal to inspection and palpation Lungs: clear to auscultation bilaterally, severe scoliosis with decreased breath sounds Breasts: normal appearance, no masses or tenderness Heart: regular rate and rhythm Abdomen: soft, non-tender; no masses,  no organomegaly Extremities:  extremities normal, atraumatic, no cyanosis or edema Skin: Skin color, texture, turgor normal. No rashes or lesions Lymph nodes: Cervical, supraclavicular, and axillary nodes normal. No abnormal inguinal nodes palpated Neurologic: Grossly normal   Pelvic: External genitalia:  no lesions              Urethra:  normal appearing urethra with no masses, tenderness or lesions              Bartholin's and Skene's: normal                 Vagina: normal appearing vagina with normal color and discharge, no lesions              Cervix: anteverted              Pap taken: No. 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 Due now - thinks it was done last year.  Counseled on breast self exam, mammography screening, adequate intake of calcium and vitamin D, diet and exercise return annually or prn  An After Visit Summary was printed and given to the patient.

## 2014-07-02 NOTE — Progress Notes (Signed)
Reviewed personally.  M. Suzanne Mecca Barga, MD.  

## 2014-10-26 ENCOUNTER — Encounter: Payer: Self-pay | Admitting: Internal Medicine

## 2014-10-26 ENCOUNTER — Ambulatory Visit (INDEPENDENT_AMBULATORY_CARE_PROVIDER_SITE_OTHER): Payer: Medicare Other | Admitting: Internal Medicine

## 2014-10-26 VITALS — BP 126/84 | HR 58 | Ht 60.0 in | Wt 127.0 lb

## 2014-10-26 DIAGNOSIS — T782XXA Anaphylactic shock, unspecified, initial encounter: Secondary | ICD-10-CM

## 2014-10-26 DIAGNOSIS — Z23 Encounter for immunization: Secondary | ICD-10-CM | POA: Diagnosis not present

## 2014-10-26 DIAGNOSIS — J310 Chronic rhinitis: Secondary | ICD-10-CM | POA: Diagnosis not present

## 2014-10-26 DIAGNOSIS — T63441A Toxic effect of venom of bees, accidental (unintentional), initial encounter: Secondary | ICD-10-CM

## 2014-10-26 DIAGNOSIS — L509 Urticaria, unspecified: Secondary | ICD-10-CM

## 2014-10-26 DIAGNOSIS — R059 Cough, unspecified: Secondary | ICD-10-CM

## 2014-10-26 DIAGNOSIS — J410 Simple chronic bronchitis: Secondary | ICD-10-CM

## 2014-10-26 DIAGNOSIS — R05 Cough: Secondary | ICD-10-CM

## 2014-10-26 MED ORDER — AZELASTINE HCL 0.1 % NA SOLN: NASAL | Status: DC

## 2014-10-26 MED ORDER — CETIRIZINE HCL 10 MG PO TABS: 10.0000 mg | ORAL_TABLET | Freq: Every day | ORAL | Status: AC

## 2014-10-26 NOTE — Progress Notes (Signed)
03/15/13- 75 yoF former smoker referred courtesy of Dr Linna Darner; chronic cough and drainage. Bothersome chronic cough, present for years, worse lying down or with eating. Smoked one pack per day for only a few years, ending in 51. Had flu vaccine. Describes perennial head congestion with mucus and drainage, cough occasionally productive with episodes of bronchitis. Sputum mostly white. Mild asthma with occasional wheeze. Breathing does not wake her. Symbicort no help. Bothersome postnasal drip. Remote pneumonia. Chest x-ray 2013 question middle lobe density. Severe scoliosis-swims and does yoga. Grew up in a smoking family. X line history of reflux worse with acid foods. Remote history of hives associated with exercise. CXR 09/03/11 IMPRESSION:  1. No acute disease.  2. Findings compatible with emphysema.  3. Marked scoliosis.  Original Report Authenticated By: Arvid Right. D'ALESSIO, M.D.  05/16/13-62 yoF former smoker referred courtesy of Dr Linna Darner; chronic rhinitis and bronchitis, complicated by scoliosis FOLLOWS FOR: feels like allergies have gotten worse since last visit; states she is having alot of drainage from nasal area to throat-sometimes can get mucus up and its brown in color. Still persistent head congestion and sneezing. Scant nasal discharge. Persistent nonproductive cough Allergy Profile 03/15/2013-total IgE 11.6 with elevations only for ragweed. Spiriva made cough worse. Dymista nasal spray was a big help and also reduced cough. CXR 03/17/13 IMPRESSION:  No active cardiopulmonary disease. Chronic findings as described  above.  Electronically Signed  By: Sabino Dick M.D.  On: 03/15/2013 16:29  10/27/13- 35 yoF former smoker referred courtesy of Dr Linna Darner; chronic rhinitis and bronchitis, complicated by scoliosis ACUTE VISIT: stuffy nose, deep dry cough; has recently been around sick grandkids -they had strep and deep cough.3 weeks ago visit in [place]. Had some cough  but did not feel sick. When exposed to sick grandchildren then 4 days later had increased coughing. Yesterday started rhinorrhea. Never had fever, sore throat, swollen glands, myalgias or GI upset.  04/26/14- 63 yoF former smoker followed for chronic rhinitis and bronchitis, complicated by scoliosis, hx bee sting anaphylaxis/ Epipen FOLLOWS DEY:CXKGYJEHU to have cough(pt states this is normal for her) and gets better through out the day. Has restarted Zyrtec which helps cough. Bad bronchitis episode in October responded to Z pak. Discussed hx bee sting/ YJ. Has Epipen.   10/26/14- 73 yoF former smoker followed for chronic rhinitis and bronchitis, complicated by scoliosis, hx bee sting anaphylaxis/ Epipen Follows for:Pt. is feeling better still has a cough. drainage down the back of the throat. Dymista nasal spray has worked well but too expensive. Still feeling top down drip area daily Zyrtec. Swims regularly. CXR 04/26/14 image reviewed with her- note marked scoliosis IMPRESSION: COPD. There is no evidence of pneumonia. New atelectasis versus scarring is present in the lower left lung inferiorly. Electronically Signed  By: David Martinique  On: 04/26/2014 11:46  ROS-see HPI Constitutional:   No-   weight loss, night sweats, fevers, chills, fatigue, lassitude. HEENT:   No-  headaches, difficulty swallowing, tooth/dental problems, sore throat,       No-  sneezing, itching, ear ache, nasal congestion, +post nasal drip,  CV:  No-   chest pain, orthopnea, PND, swelling in lower extremities, anasarca, dizziness, palpitations Resp: + shortness of breath with exertion or at rest.              No-  productive cough, + non-productive cough,  No- coughing up of blood.              No-   change  in color of mucus.  No- wheezing.   Skin: No-   rash or lesions. GI:  + heartburn, indigestion, no-abdominal pain, nausea, vomiting,  GU:  MS:  No-   joint pain or swelling. .  + back pain. Neuro-     nothing  unusual Psych:  No- change in mood or affect. No depression or anxiety.  No memory loss.  OBJ- Physical Exam General- Alert, Oriented, Affect-appropriate, Distress- none acute Skin- rash-none, lesions- none, excoriation- none Lymphadenopathy- none Head- atraumatic            Eyes- Gross vision intact, PERRLA, conjunctivae and secretions clear            Ears- Hearing, canals-normal            Nose- +sniffing, +Septal dev, no-mucus, polyps, erosion, perforation             Throat- Mallampati II , mucosa clear , drainage- none, tonsils- atrophic Neck- flexible , trachea midline, no stridor , thyroid nl, carotid no bruit Chest - symmetrical excursion , unlabored           Heart/CV- RRR , no murmur , no gallop  , no rub, nl s1 s2                           - JVD- none , edema- none, stasis changes- none, varices- none           Lung- clear to P&A, wheeze- none, cough+ light , dullness-none, rub- none           Chest wall- +scoliosis Abd-  Br/ Gen/ Rectal- Not done, not indicated Extrem- cyanosis- none, clubbing, none, atrophy- none, strength- nl Neuro- grossly intact to observation

## 2014-10-26 NOTE — Patient Instructions (Addendum)
Script sent to refill cetirizine/ Zyrtec  Script sent for Astelin/ azelastine antihistamine nasal spray to try instead of Dymista  Consider trying otc Nasalcrom/ cromolyn/ cromol nasal spray  Please call as needed  Prevnar 13

## 2014-11-13 ENCOUNTER — Encounter: Payer: Self-pay | Admitting: Internal Medicine

## 2014-11-13 NOTE — Assessment & Plan Note (Signed)
She doesn't seem to have significant obstructive airways disease although she may have mild intermittent asthmatic bronchitis. Plan-watch symptom response to treatment for rhinitis and postnasal drip.

## 2014-11-13 NOTE — Assessment & Plan Note (Signed)
Symptomatic improvement by Dymista nasal spray. Because of cost we will try Astelin nasal spray. She could also try OTC Nasalcrom

## 2015-01-01 DIAGNOSIS — Z0001 Encounter for general adult medical examination with abnormal findings: Secondary | ICD-10-CM | POA: Diagnosis not present

## 2015-01-01 DIAGNOSIS — Z86010 Personal history of colonic polyps: Secondary | ICD-10-CM | POA: Diagnosis not present

## 2015-01-01 DIAGNOSIS — E782 Mixed hyperlipidemia: Secondary | ICD-10-CM | POA: Diagnosis not present

## 2015-01-01 DIAGNOSIS — M419 Scoliosis, unspecified: Secondary | ICD-10-CM | POA: Diagnosis not present

## 2015-01-01 DIAGNOSIS — R011 Cardiac murmur, unspecified: Secondary | ICD-10-CM | POA: Diagnosis not present

## 2015-02-05 DIAGNOSIS — M47816 Spondylosis without myelopathy or radiculopathy, lumbar region: Secondary | ICD-10-CM | POA: Diagnosis not present

## 2015-02-05 DIAGNOSIS — M791 Myalgia: Secondary | ICD-10-CM | POA: Diagnosis not present

## 2015-02-05 DIAGNOSIS — M47814 Spondylosis without myelopathy or radiculopathy, thoracic region: Secondary | ICD-10-CM | POA: Diagnosis not present

## 2015-02-05 DIAGNOSIS — M9903 Segmental and somatic dysfunction of lumbar region: Secondary | ICD-10-CM | POA: Diagnosis not present

## 2015-02-05 DIAGNOSIS — M9905 Segmental and somatic dysfunction of pelvic region: Secondary | ICD-10-CM | POA: Diagnosis not present

## 2015-02-05 DIAGNOSIS — M9902 Segmental and somatic dysfunction of thoracic region: Secondary | ICD-10-CM | POA: Diagnosis not present

## 2015-02-05 DIAGNOSIS — M9906 Segmental and somatic dysfunction of lower extremity: Secondary | ICD-10-CM | POA: Diagnosis not present

## 2015-02-08 DIAGNOSIS — M9903 Segmental and somatic dysfunction of lumbar region: Secondary | ICD-10-CM | POA: Diagnosis not present

## 2015-02-08 DIAGNOSIS — M47816 Spondylosis without myelopathy or radiculopathy, lumbar region: Secondary | ICD-10-CM | POA: Diagnosis not present

## 2015-02-08 DIAGNOSIS — M791 Myalgia: Secondary | ICD-10-CM | POA: Diagnosis not present

## 2015-02-08 DIAGNOSIS — M9902 Segmental and somatic dysfunction of thoracic region: Secondary | ICD-10-CM | POA: Diagnosis not present

## 2015-02-08 DIAGNOSIS — M9906 Segmental and somatic dysfunction of lower extremity: Secondary | ICD-10-CM | POA: Diagnosis not present

## 2015-02-08 DIAGNOSIS — M47814 Spondylosis without myelopathy or radiculopathy, thoracic region: Secondary | ICD-10-CM | POA: Diagnosis not present

## 2015-02-08 DIAGNOSIS — M9905 Segmental and somatic dysfunction of pelvic region: Secondary | ICD-10-CM | POA: Diagnosis not present

## 2015-04-16 DIAGNOSIS — Z803 Family history of malignant neoplasm of breast: Secondary | ICD-10-CM | POA: Diagnosis not present

## 2015-04-16 DIAGNOSIS — Z1231 Encounter for screening mammogram for malignant neoplasm of breast: Secondary | ICD-10-CM | POA: Diagnosis not present

## 2015-04-30 ENCOUNTER — Ambulatory Visit: Payer: PRIVATE HEALTH INSURANCE | Admitting: Internal Medicine

## 2015-04-30 DIAGNOSIS — M8589 Other specified disorders of bone density and structure, multiple sites: Secondary | ICD-10-CM | POA: Diagnosis not present

## 2015-04-30 DIAGNOSIS — Z1231 Encounter for screening mammogram for malignant neoplasm of breast: Secondary | ICD-10-CM | POA: Diagnosis not present

## 2015-04-30 DIAGNOSIS — Z803 Family history of malignant neoplasm of breast: Secondary | ICD-10-CM | POA: Diagnosis not present

## 2015-06-14 DIAGNOSIS — Z23 Encounter for immunization: Secondary | ICD-10-CM | POA: Diagnosis not present

## 2015-06-22 ENCOUNTER — Other Ambulatory Visit: Payer: Self-pay | Admitting: Gastroenterology

## 2015-06-26 ENCOUNTER — Telehealth: Payer: Self-pay | Admitting: Internal Medicine

## 2015-06-26 DIAGNOSIS — R05 Cough: Secondary | ICD-10-CM

## 2015-06-26 DIAGNOSIS — T782XXA Anaphylactic shock, unspecified, initial encounter: Secondary | ICD-10-CM

## 2015-06-26 DIAGNOSIS — R059 Cough, unspecified: Secondary | ICD-10-CM

## 2015-06-26 DIAGNOSIS — L509 Urticaria, unspecified: Secondary | ICD-10-CM

## 2015-06-26 DIAGNOSIS — T63441A Toxic effect of venom of bees, accidental (unintentional), initial encounter: Secondary | ICD-10-CM

## 2015-06-26 MED ORDER — EPINEPHRINE 0.3 MG/0.3ML IJ SOAJ: 0.3000 mg | Freq: Once | INTRAMUSCULAR | Status: AC

## 2015-06-26 NOTE — Telephone Encounter (Signed)
Pt needing generic epi pen refilled to [facility] on [address].  This has been sent.  Nothing further needed.

## 2015-07-03 ENCOUNTER — Ambulatory Visit (INDEPENDENT_AMBULATORY_CARE_PROVIDER_SITE_OTHER): Payer: Medicare Other | Admitting: Nurse Practitioner

## 2015-07-03 ENCOUNTER — Encounter: Payer: Self-pay | Admitting: Nurse Practitioner

## 2015-07-03 ENCOUNTER — Ambulatory Visit: Payer: Medicare Other | Admitting: Nurse Practitioner

## 2015-07-03 VITALS — BP 116/78 | HR 72 | Ht 60.0 in | Wt 121.0 lb

## 2015-07-03 DIAGNOSIS — Z01419 Encounter for gynecological examination (general) (routine) without abnormal findings: Secondary | ICD-10-CM | POA: Diagnosis not present

## 2015-07-03 DIAGNOSIS — Z0000 Encounter for general adult medical examination without abnormal findings: Secondary | ICD-10-CM

## 2015-07-03 DIAGNOSIS — Z124 Encounter for screening for malignant neoplasm of cervix: Secondary | ICD-10-CM

## 2015-07-03 NOTE — Patient Instructions (Signed)

## 2015-07-03 NOTE — Progress Notes (Signed)
Patient ID: Megan Downs, female   DOB: 10/06/1950, 65 y.o.   MRN: [id number]  66 y.o. [id number] Married  Caucasian Fe here for annual exam.  The only new problem is plantar fascitis.  They have downsized into a small rental home and then bought a 27 ft [employer].  They are enjoying traveling. She continues to swim and play pickle ball.  Patient's last menstrual period was 06/09/2001.          Sexually active: Yes.    The current method of family planning is post menopausal status.    Exercising: Yes.    swimming 3 times per week and pickleball 3-4 times per week Smoker:  no  Health Maintenance: Pap:  06/22/12, Negative with neg HR HPV MMG: 04/16/2015 BI-RADS 2 - normal Colonoscopy:  12/2009, normal repeat in 5 years, scheduled for 07/23/15 BMD:  2016 BIRADS 2 - Osteopenia. T Score: left radius -1.70; right hip neck -2.0; left hip neck -1.60 TDaP: 09/18/09 Shingles: 04/11/13 Pneumonia: 03/21/13, 10/26/14 Hep C and HIV: Hep C today, HIV not indicated due to age Labs:  PCP, Dr Inda Merlin   reports that she quit smoking about 43 years ago. Her smoking use included Cigarettes. She has a 2 pack-year smoking history. She has never used smokeless tobacco. She reports that she drinks about 1.8 oz of alcohol per week. She reports that she does not use illicit drugs.  Past Medical History  Diagnosis Date  . Hyperlipidemia   . MVP (mitral valve prolapse)     Dr Leonia Reeves ,Cardiology  stated no SBE prophylaxis  . Medullary sponge kidney   . Angiolipoma of kidney     presentation as microscopic hematuria, Dr Janice Norrie  . Allergy     Environmental   . Hypertension   . Osteopenia     Past Surgical History  Procedure Laterality Date  . Tonsillectomy    . Toe surgery Right 2009    great toe, cleaning joint  . Cystoscopy  2004     for anormal kineys;Dr Nesi  . Hysteroscopy with resectoscope  2012    eval secondary to PMB with path benign    Current Outpatient Prescriptions  Medication Sig Dispense  Refill  . aspirin 81 MG tablet Take 81 mg by mouth daily.      . calcium carbonate (OS-CAL) 600 MG TABS tablet Take 1,500 mg by mouth 2 (two) times daily with a meal.    . CALCIUM CARBONATE PO Take by mouth daily.      . diphenhydrAMINE (BENADRYL) 50 MG tablet Take 1 tablet (50 mg total) by mouth every 8 (eight) hours as needed for itching or allergies. 30 tablet 1  . EPINEPHrine 0.3 mg/0.3 mL IJ SOAJ injection Inject 0.3 mLs (0.3 mg total) into the muscle once. 2 Device 2  . MAGNESIUM PO Take by mouth daily.      . Omega-3 Fatty Acids (OMEGA 3 PO) Take by mouth daily.      Marland Kitchen VITAMIN D, CHOLECALCIFEROL, PO Take by mouth.      . vitamin E 400 UNIT capsule Take 400 Units by mouth daily.      Marland Kitchen azelastine (ASTELIN) 0.1 % nasal spray 1-2 puffs each nostril twice daily as needed (Patient not taking: Reported on 07/03/2015) 30 mL 12  . cetirizine (ZYRTEC) 10 MG tablet Take 1 tablet (10 mg total) by mouth daily. (Patient not taking: Reported on 07/03/2015) 30 tablet 12   No current facility-administered medications for this visit.  Family History  Problem Relation Age of Onset  . Breast cancer Mother   . Cancer Father     esophageal cancer  . Prostate cancer Brother   . Lung cancer Brother     2 brothers  . Heart attack Maternal Grandfather     in 67s  . Heart attack Maternal Grandmother     in 74s  . Heart failure Maternal Grandmother 76  . Kidney failure Paternal Grandmother   . Heart failure Paternal Grandfather     in 15s  . Asthma Neg Hx   . COPD Neg Hx   . Diabetes Neg Hx     ROS:  Pertinent items are noted in HPI.  Otherwise, a comprehensive ROS was negative.  Exam:   BP 116/78 mmHg  Pulse 72  Ht 5' (1.524 m)  Wt 121 lb (54.885 kg)  BMI 23.63 kg/m2  LMP 06/09/2001 Height: 5' (152.4 cm) Ht Readings from Last 3 Encounters:  07/03/15 5' (1.524 m)  10/26/14 5' (1.524 m)  06/29/14 4' 11.5" (1.511 m)    General appearance: alert, cooperative and appears stated age Head:  Normocephalic, without obvious abnormality, atraumatic Neck: no adenopathy, supple, symmetrical, trachea midline and thyroid normal to inspection and palpation Lungs: clear to auscultation bilaterally Breasts: normal appearance, no masses or tenderness Heart: regular rate and rhythm Abdomen: soft, non-tender; no masses,  no organomegaly Extremities: extremities normal, atraumatic, no cyanosis or edema Skin: Skin color, texture, turgor normal. No rashes or lesions Lymph nodes: Cervical, supraclavicular, and axillary nodes normal. No abnormal inguinal nodes palpated Neurologic: Grossly normal   Pelvic: External genitalia:  no lesions              Urethra:  normal appearing urethra with no masses, tenderness or lesions              Bartholin's and Skene's: normal                 Vagina: normal appearing vagina with normal color and discharge, no lesions              Cervix: anteverted              Pap taken: Yes.   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

## 2015-07-04 LAB — HEPATITIS C ANTIBODY: HCV Ab: NEGATIVE

## 2015-07-05 LAB — IPS PAP SMEAR ONLY

## 2015-07-06 ENCOUNTER — Encounter: Payer: Self-pay | Admitting: Nurse Practitioner

## 2015-07-06 NOTE — Telephone Encounter (Signed)
Responded to patient via mychart.  Routing to .Kem Boroughs, FNP for review.

## 2015-07-08 NOTE — Progress Notes (Signed)
Encounter reviewed by Dr. Krisalyn Yankowski Amundson C. Silva.  

## 2015-07-13 ENCOUNTER — Encounter (HOSPITAL_COMMUNITY): Payer: Self-pay | Admitting: *Deleted

## 2015-07-17 DIAGNOSIS — M9903 Segmental and somatic dysfunction of lumbar region: Secondary | ICD-10-CM | POA: Diagnosis not present

## 2015-07-17 DIAGNOSIS — M9902 Segmental and somatic dysfunction of thoracic region: Secondary | ICD-10-CM | POA: Diagnosis not present

## 2015-07-17 DIAGNOSIS — M545 Low back pain: Secondary | ICD-10-CM | POA: Diagnosis not present

## 2015-07-17 DIAGNOSIS — M4124 Other idiopathic scoliosis, thoracic region: Secondary | ICD-10-CM | POA: Diagnosis not present

## 2015-07-19 DIAGNOSIS — M9903 Segmental and somatic dysfunction of lumbar region: Secondary | ICD-10-CM | POA: Diagnosis not present

## 2015-07-19 DIAGNOSIS — M4124 Other idiopathic scoliosis, thoracic region: Secondary | ICD-10-CM | POA: Diagnosis not present

## 2015-07-19 DIAGNOSIS — M9902 Segmental and somatic dysfunction of thoracic region: Secondary | ICD-10-CM | POA: Diagnosis not present

## 2015-07-19 DIAGNOSIS — M545 Low back pain: Secondary | ICD-10-CM | POA: Diagnosis not present

## 2015-07-23 ENCOUNTER — Ambulatory Visit (HOSPITAL_COMMUNITY): Payer: Medicare Other | Admitting: Anesthesiology

## 2015-07-23 ENCOUNTER — Ambulatory Visit (HOSPITAL_COMMUNITY)
Admission: RE | Admit: 2015-07-23 | Discharge: 2015-07-23 | Disposition: A | Discharge status: Home / Self Care | Payer: Medicare Other | Source: Ambulatory Visit | Attending: Gastroenterology | Admitting: Gastroenterology

## 2015-07-23 ENCOUNTER — Encounter (HOSPITAL_COMMUNITY)
Admission: RE | Disposition: A | Discharge status: Home / Self Care | Payer: Self-pay | Source: Ambulatory Visit | Attending: Gastroenterology

## 2015-07-23 ENCOUNTER — Encounter (HOSPITAL_COMMUNITY): Payer: Self-pay | Admitting: *Deleted

## 2015-07-23 DIAGNOSIS — Z86010 Personal history of colonic polyps: Secondary | ICD-10-CM | POA: Diagnosis not present

## 2015-07-23 DIAGNOSIS — Z1211 Encounter for screening for malignant neoplasm of colon: Secondary | ICD-10-CM | POA: Insufficient documentation

## 2015-07-23 DIAGNOSIS — K5730 Diverticulosis of large intestine without perforation or abscess without bleeding: Secondary | ICD-10-CM | POA: Diagnosis not present

## 2015-07-23 DIAGNOSIS — D125 Benign neoplasm of sigmoid colon: Secondary | ICD-10-CM | POA: Insufficient documentation

## 2015-07-23 DIAGNOSIS — Z87891 Personal history of nicotine dependence: Secondary | ICD-10-CM | POA: Diagnosis not present

## 2015-07-23 DIAGNOSIS — K635 Polyp of colon: Secondary | ICD-10-CM | POA: Diagnosis not present

## 2015-07-23 DIAGNOSIS — I10 Essential (primary) hypertension: Secondary | ICD-10-CM | POA: Diagnosis not present

## 2015-07-23 DIAGNOSIS — K219 Gastro-esophageal reflux disease without esophagitis: Secondary | ICD-10-CM | POA: Insufficient documentation

## 2015-07-23 DIAGNOSIS — I341 Nonrheumatic mitral (valve) prolapse: Secondary | ICD-10-CM | POA: Insufficient documentation

## 2015-07-23 HISTORY — PX: COLONOSCOPY WITH PROPOFOL: SHX5780

## 2015-07-23 HISTORY — DX: Gastro-esophageal reflux disease without esophagitis: K21.9

## 2015-07-23 SURGERY — COLONOSCOPY WITH PROPOFOL
Anesthesia: Monitor Anesthesia Care

## 2015-07-23 MED ORDER — LACTATED RINGERS IV SOLN
INTRAVENOUS | Status: DC
Start: 2015-07-23 — End: 2015-07-23
  Administered 2015-07-23: 1000 mL via INTRAVENOUS

## 2015-07-23 MED ORDER — PROPOFOL 500 MG/50ML IV EMUL
INTRAVENOUS | Status: DC | PRN
  Administered 2015-07-23: 125 ug/kg/min via INTRAVENOUS

## 2015-07-23 MED ORDER — SODIUM CHLORIDE 0.9 % IV SOLN: INTRAVENOUS | Status: DC

## 2015-07-23 MED ORDER — PROPOFOL 10 MG/ML IV BOLUS
INTRAVENOUS | Status: DC | PRN
  Administered 2015-07-23: 20 mg via INTRAVENOUS
  Administered 2015-07-23 (×3): 10 mg via INTRAVENOUS
  Administered 2015-07-23: 20 mg via INTRAVENOUS
  Administered 2015-07-23: 10 mg via INTRAVENOUS

## 2015-07-23 MED ORDER — PROPOFOL 10 MG/ML IV BOLUS
INTRAVENOUS | Status: AC
  Filled 2015-07-23: qty 40

## 2015-07-23 SURGICAL SUPPLY — 22 items

## 2015-07-23 NOTE — H&P (Signed)
  Procedure: Surveillance colonoscopy. Normal surveillance colonoscopy performed on 12/01/2009. Colonoscopy performed with removal of a diminutive sigmoid colon adenomatous polyp and diminutive rectal carcinoid polyp performed on 08/22/2004  History: The patient is a 65 year old female born [birth year]. She is scheduled to undergo a surveillance colonoscopy today.  Past medical history: Right foot surgery. Gastroesophageal reflux. Hypertension. Aortic sclerosis.  Exam: The patient is alert and lying comfortably on the endoscopy stretcher. Abdomen is soft and nontender to palpation. Lungs are clear to auscultation. Cardiac exam reveals a regular rhythm.  Plan: Proceed with surveillance colonoscopy

## 2015-07-23 NOTE — Transfer of Care (Signed)
Immediate Anesthesia Transfer of Care Note  Patient: Megan Downs  Procedure(s) Performed: Procedure(s): COLONOSCOPY WITH PROPOFOL (N/A)  Patient Location: PACU and Endoscopy Unit  Anesthesia Type:MAC  Level of Consciousness: awake and patient cooperative  Airway & Oxygen Therapy: Patient Spontanous Breathing and Patient connected to face mask oxygen  Post-op Assessment: Report given to RN and Post -op Vital signs reviewed and stable  Post vital signs: Reviewed and stable  Last Vitals:  Filed Vitals:   07/23/15 1233  BP: 159/90  Temp: 36.8 C  Resp: 9    Complications: No apparent anesthesia complications

## 2015-07-23 NOTE — Op Note (Signed)
Procedure: Surveillance colonoscopy. Normal surveillance colonoscopy performed on 12/01/2009. On 08/22/2004 colonoscopy was performed with removal of a diminutive sigmoid colon adenomatous polyp and diminutive rectal carcinoid polyp  Endoscopist: Earle Gell  Premedication: Propofol administered by anesthesia  Procedure: The patient was placed in the left lateral decubitus position. Anal inspection and digital rectal exam were normal. The Pentax pediatric colonoscope was introduced into the rectum and advanced to the cecum. A normal-appearing appendiceal orifice and ileocecal valve were identified. Colonic preparation for the exam today was good. Withdrawal time was 18 minutes  Rectum. Normal. Retroflexed view of the distal rectum was normal  Sigmoid colon and descending colon. Colonic diverticulosis. A 5 mm sessile polyp was removed with the electrocautery snare from the distal sigmoid colon  Splenic flexure. Normal  Transverse colon. Normal  Hepatic flexure. Normal  Ascending colon. Normal  Cecum and ileocecal valve. Normal  Assessment: A small polyp was removed from the distal sigmoid colon. Otherwise normal colonoscopy  Recommendation: If the polyp returns adenomatous pathologically, schedule repeat colonoscopy in 5 years. If the polyp returns non-neoplastic pathologically, repeat colonoscopy in 10 years

## 2015-07-23 NOTE — Discharge Instructions (Signed)

## 2015-07-23 NOTE — Anesthesia Postprocedure Evaluation (Signed)
Anesthesia Post Note  Patient: Megan Downs  Procedure(s) Performed: Procedure(s) (LRB): COLONOSCOPY WITH PROPOFOL (N/A)  Patient location during evaluation: PACU Anesthesia Type: MAC Level of consciousness: awake and alert Pain management: pain level controlled Vital Signs Assessment: post-procedure vital signs reviewed and stable Respiratory status: spontaneous breathing, nonlabored ventilation, respiratory function stable and patient connected to nasal cannula oxygen Cardiovascular status: blood pressure returned to baseline and stable Postop Assessment: no signs of nausea or vomiting Anesthetic complications: no    Last Vitals:  Filed Vitals:   07/23/15 1350 07/23/15 1400  BP: 128/74 139/78  Pulse: 53 55  Temp:    Resp: 21 16    Last Pain: There were no vitals filed for this visit.               Kye Silverstein L

## 2015-07-23 NOTE — Anesthesia Preprocedure Evaluation (Signed)
Anesthesia Evaluation  Patient identified by MRN, date of birth, ID band Patient awake    Reviewed: Allergy & Precautions, H&P , NPO status , Patient's Chart, lab work & pertinent test results  Airway Mallampati: II  TM Distance: >3 FB Neck ROM: full    Dental no notable dental hx. (+) Dental Advisory Given, Teeth Intact   Pulmonary neg pulmonary ROS, former smoker,  Chronic bronchitis - simple   Pulmonary exam normal breath sounds clear to auscultation       Cardiovascular Exercise Tolerance: Good hypertension, Normal cardiovascular exam+ Valvular Problems/Murmurs MVP  Rhythm:regular Rate:Normal     Neuro/Psych negative neurological ROS  negative psych ROS   GI/Hepatic negative GI ROS, Neg liver ROS,   Endo/Other  negative endocrine ROS  Renal/GU negative Renal ROS  negative genitourinary   Musculoskeletal   Abdominal   Peds  Hematology negative hematology ROS (+)   Anesthesia Other Findings   Reproductive/Obstetrics negative OB ROS                             Anesthesia Physical Anesthesia Plan  ASA: II  Anesthesia Plan: MAC   Post-op Pain Management:    Induction:   Airway Management Planned:   Additional Equipment:   Intra-op Plan:   Post-operative Plan:   Informed Consent: I have reviewed the patients History and Physical, chart, labs and discussed the procedure including the risks, benefits and alternatives for the proposed anesthesia with the patient or authorized representative who has indicated his/her understanding and acceptance.   Dental Advisory Given  Plan Discussed with: CRNA  Anesthesia Plan Comments:         Anesthesia Quick Evaluation

## 2015-07-24 ENCOUNTER — Encounter (HOSPITAL_COMMUNITY): Payer: Self-pay | Admitting: Gastroenterology

## 2015-07-24 DIAGNOSIS — M4124 Other idiopathic scoliosis, thoracic region: Secondary | ICD-10-CM | POA: Diagnosis not present

## 2015-07-24 DIAGNOSIS — M9902 Segmental and somatic dysfunction of thoracic region: Secondary | ICD-10-CM | POA: Diagnosis not present

## 2015-07-24 DIAGNOSIS — M545 Low back pain: Secondary | ICD-10-CM | POA: Diagnosis not present

## 2015-07-24 DIAGNOSIS — M9903 Segmental and somatic dysfunction of lumbar region: Secondary | ICD-10-CM | POA: Diagnosis not present

## 2015-07-26 DIAGNOSIS — M9902 Segmental and somatic dysfunction of thoracic region: Secondary | ICD-10-CM | POA: Diagnosis not present

## 2015-07-26 DIAGNOSIS — M545 Low back pain: Secondary | ICD-10-CM | POA: Diagnosis not present

## 2015-07-26 DIAGNOSIS — M4124 Other idiopathic scoliosis, thoracic region: Secondary | ICD-10-CM | POA: Diagnosis not present

## 2015-07-26 DIAGNOSIS — M9903 Segmental and somatic dysfunction of lumbar region: Secondary | ICD-10-CM | POA: Diagnosis not present

## 2015-07-30 DIAGNOSIS — M9903 Segmental and somatic dysfunction of lumbar region: Secondary | ICD-10-CM | POA: Diagnosis not present

## 2015-07-30 DIAGNOSIS — M9902 Segmental and somatic dysfunction of thoracic region: Secondary | ICD-10-CM | POA: Diagnosis not present

## 2015-07-30 DIAGNOSIS — M545 Low back pain: Secondary | ICD-10-CM | POA: Diagnosis not present

## 2015-07-30 DIAGNOSIS — M4124 Other idiopathic scoliosis, thoracic region: Secondary | ICD-10-CM | POA: Diagnosis not present

## 2015-08-10 ENCOUNTER — Ambulatory Visit: Payer: PRIVATE HEALTH INSURANCE | Admitting: Internal Medicine

## 2015-08-21 DIAGNOSIS — M9902 Segmental and somatic dysfunction of thoracic region: Secondary | ICD-10-CM | POA: Diagnosis not present

## 2015-08-21 DIAGNOSIS — M9903 Segmental and somatic dysfunction of lumbar region: Secondary | ICD-10-CM | POA: Diagnosis not present

## 2015-08-21 DIAGNOSIS — M545 Low back pain: Secondary | ICD-10-CM | POA: Diagnosis not present

## 2015-08-21 DIAGNOSIS — M4124 Other idiopathic scoliosis, thoracic region: Secondary | ICD-10-CM | POA: Diagnosis not present

## 2015-08-28 DIAGNOSIS — M545 Low back pain: Secondary | ICD-10-CM | POA: Diagnosis not present

## 2015-08-28 DIAGNOSIS — M4124 Other idiopathic scoliosis, thoracic region: Secondary | ICD-10-CM | POA: Diagnosis not present

## 2015-08-28 DIAGNOSIS — M9903 Segmental and somatic dysfunction of lumbar region: Secondary | ICD-10-CM | POA: Diagnosis not present

## 2015-08-28 DIAGNOSIS — M9902 Segmental and somatic dysfunction of thoracic region: Secondary | ICD-10-CM | POA: Diagnosis not present

## 2015-09-04 DIAGNOSIS — M545 Low back pain: Secondary | ICD-10-CM | POA: Diagnosis not present

## 2015-09-04 DIAGNOSIS — M9902 Segmental and somatic dysfunction of thoracic region: Secondary | ICD-10-CM | POA: Diagnosis not present

## 2015-09-04 DIAGNOSIS — M4124 Other idiopathic scoliosis, thoracic region: Secondary | ICD-10-CM | POA: Diagnosis not present

## 2015-09-04 DIAGNOSIS — M9903 Segmental and somatic dysfunction of lumbar region: Secondary | ICD-10-CM | POA: Diagnosis not present

## 2015-09-11 DIAGNOSIS — M545 Low back pain: Secondary | ICD-10-CM | POA: Diagnosis not present

## 2015-09-11 DIAGNOSIS — M9903 Segmental and somatic dysfunction of lumbar region: Secondary | ICD-10-CM | POA: Diagnosis not present

## 2015-09-11 DIAGNOSIS — M4124 Other idiopathic scoliosis, thoracic region: Secondary | ICD-10-CM | POA: Diagnosis not present

## 2015-09-11 DIAGNOSIS — M9902 Segmental and somatic dysfunction of thoracic region: Secondary | ICD-10-CM | POA: Diagnosis not present

## 2015-09-18 DIAGNOSIS — M9903 Segmental and somatic dysfunction of lumbar region: Secondary | ICD-10-CM | POA: Diagnosis not present

## 2015-09-18 DIAGNOSIS — M545 Low back pain: Secondary | ICD-10-CM | POA: Diagnosis not present

## 2015-09-18 DIAGNOSIS — M9902 Segmental and somatic dysfunction of thoracic region: Secondary | ICD-10-CM | POA: Diagnosis not present

## 2015-09-18 DIAGNOSIS — M4124 Other idiopathic scoliosis, thoracic region: Secondary | ICD-10-CM | POA: Diagnosis not present

## 2015-09-25 DIAGNOSIS — M9903 Segmental and somatic dysfunction of lumbar region: Secondary | ICD-10-CM | POA: Diagnosis not present

## 2015-09-25 DIAGNOSIS — M9902 Segmental and somatic dysfunction of thoracic region: Secondary | ICD-10-CM | POA: Diagnosis not present

## 2015-09-25 DIAGNOSIS — M545 Low back pain: Secondary | ICD-10-CM | POA: Diagnosis not present

## 2015-09-25 DIAGNOSIS — M4124 Other idiopathic scoliosis, thoracic region: Secondary | ICD-10-CM | POA: Diagnosis not present

## 2015-10-04 ENCOUNTER — Ambulatory Visit: Payer: PRIVATE HEALTH INSURANCE | Admitting: Internal Medicine

## 2015-10-09 DIAGNOSIS — M9903 Segmental and somatic dysfunction of lumbar region: Secondary | ICD-10-CM | POA: Diagnosis not present

## 2015-10-09 DIAGNOSIS — M9902 Segmental and somatic dysfunction of thoracic region: Secondary | ICD-10-CM | POA: Diagnosis not present

## 2015-10-09 DIAGNOSIS — M545 Low back pain: Secondary | ICD-10-CM | POA: Diagnosis not present

## 2015-10-09 DIAGNOSIS — M4124 Other idiopathic scoliosis, thoracic region: Secondary | ICD-10-CM | POA: Diagnosis not present

## 2015-10-23 DIAGNOSIS — M9903 Segmental and somatic dysfunction of lumbar region: Secondary | ICD-10-CM | POA: Diagnosis not present

## 2015-10-23 DIAGNOSIS — M4124 Other idiopathic scoliosis, thoracic region: Secondary | ICD-10-CM | POA: Diagnosis not present

## 2015-10-23 DIAGNOSIS — M545 Low back pain: Secondary | ICD-10-CM | POA: Diagnosis not present

## 2015-10-23 DIAGNOSIS — M9902 Segmental and somatic dysfunction of thoracic region: Secondary | ICD-10-CM | POA: Diagnosis not present

## 2015-10-30 DIAGNOSIS — M4124 Other idiopathic scoliosis, thoracic region: Secondary | ICD-10-CM | POA: Diagnosis not present

## 2015-10-30 DIAGNOSIS — M9902 Segmental and somatic dysfunction of thoracic region: Secondary | ICD-10-CM | POA: Diagnosis not present

## 2015-10-30 DIAGNOSIS — M9903 Segmental and somatic dysfunction of lumbar region: Secondary | ICD-10-CM | POA: Diagnosis not present

## 2015-10-30 DIAGNOSIS — M545 Low back pain: Secondary | ICD-10-CM | POA: Diagnosis not present

## 2015-11-12 DIAGNOSIS — M9902 Segmental and somatic dysfunction of thoracic region: Secondary | ICD-10-CM | POA: Diagnosis not present

## 2015-11-12 DIAGNOSIS — M4124 Other idiopathic scoliosis, thoracic region: Secondary | ICD-10-CM | POA: Diagnosis not present

## 2015-11-12 DIAGNOSIS — M9903 Segmental and somatic dysfunction of lumbar region: Secondary | ICD-10-CM | POA: Diagnosis not present

## 2015-11-12 DIAGNOSIS — M545 Low back pain: Secondary | ICD-10-CM | POA: Diagnosis not present

## 2015-11-13 DIAGNOSIS — Z315 Encounter for genetic counseling: Secondary | ICD-10-CM | POA: Diagnosis not present

## 2015-11-14 ENCOUNTER — Encounter: Payer: Self-pay | Admitting: Genetic Counselor

## 2015-11-15 ENCOUNTER — Encounter: Payer: Self-pay | Admitting: Genetic Counselor

## 2015-11-15 ENCOUNTER — Telehealth: Payer: Self-pay | Admitting: Genetic Counselor

## 2015-11-15 NOTE — Telephone Encounter (Signed)
Patient called wanting to reschedule Genetics appointment from 7/24 to 7/31.

## 2015-12-04 DIAGNOSIS — M4124 Other idiopathic scoliosis, thoracic region: Secondary | ICD-10-CM | POA: Diagnosis not present

## 2015-12-04 DIAGNOSIS — M9903 Segmental and somatic dysfunction of lumbar region: Secondary | ICD-10-CM | POA: Diagnosis not present

## 2015-12-04 DIAGNOSIS — M9902 Segmental and somatic dysfunction of thoracic region: Secondary | ICD-10-CM | POA: Diagnosis not present

## 2015-12-04 DIAGNOSIS — M545 Low back pain: Secondary | ICD-10-CM | POA: Diagnosis not present

## 2015-12-14 ENCOUNTER — Ambulatory Visit (INDEPENDENT_AMBULATORY_CARE_PROVIDER_SITE_OTHER)
Admission: RE | Admit: 2015-12-14 | Discharge: 2015-12-14 | Disposition: A | Discharge status: Home / Self Care | Payer: Medicare Other | Source: Ambulatory Visit | Attending: Internal Medicine | Admitting: Internal Medicine

## 2015-12-14 ENCOUNTER — Encounter: Payer: Self-pay | Admitting: Internal Medicine

## 2015-12-14 ENCOUNTER — Ambulatory Visit (INDEPENDENT_AMBULATORY_CARE_PROVIDER_SITE_OTHER): Payer: Medicare Other | Admitting: Internal Medicine

## 2015-12-14 VITALS — BP 120/76 | HR 62 | Ht 60.0 in | Wt 117.0 lb

## 2015-12-14 DIAGNOSIS — J310 Chronic rhinitis: Secondary | ICD-10-CM

## 2015-12-14 DIAGNOSIS — J410 Simple chronic bronchitis: Secondary | ICD-10-CM | POA: Diagnosis not present

## 2015-12-14 DIAGNOSIS — J209 Acute bronchitis, unspecified: Secondary | ICD-10-CM

## 2015-12-14 DIAGNOSIS — T782XXD Anaphylactic shock, unspecified, subsequent encounter: Secondary | ICD-10-CM

## 2015-12-14 MED ORDER — AZELASTINE HCL 0.1 % NA SOLN: NASAL | Status: AC

## 2015-12-14 NOTE — Progress Notes (Signed)
03/15/13- 79 yoF former smoker referred courtesy of Dr Linna Darner; chronic cough and drainage. Bothersome chronic cough, present for years, worse lying down or with eating. Smoked one pack per day for only a few years, ending in 56. Had flu vaccine. Describes perennial head congestion with mucus and drainage, cough occasionally productive with episodes of bronchitis. Sputum mostly white. Mild asthma with occasional wheeze. Breathing does not wake her. Symbicort no help. Bothersome postnasal drip. Remote pneumonia. Chest x-ray 2013 question middle lobe density. Severe scoliosis-swims and does yoga. Grew up in a smoking family. X line history of reflux worse with acid foods. Remote history of hives associated with exercise. CXR 09/03/11 IMPRESSION:  1. No acute disease.  2. Findings compatible with emphysema.  3. Marked scoliosis.  Original Report Authenticated By: Arvid Right. D'ALESSIO, M.D.  05/16/13-62 yoF former smoker referred courtesy of Dr Linna Darner; chronic rhinitis and bronchitis, complicated by scoliosis FOLLOWS FOR: feels like allergies have gotten worse since last visit; states she is having alot of drainage from nasal area to throat-sometimes can get mucus up and its brown in color. Still persistent head congestion and sneezing. Scant nasal discharge. Persistent nonproductive cough Allergy Profile 03/15/2013-total IgE 11.6 with elevations only for ragweed. Spiriva made cough worse. Dymista nasal spray was a big help and also reduced cough. CXR 03/17/13 IMPRESSION:  No active cardiopulmonary disease. Chronic findings as described  above.  Electronically Signed  By: Sabino Dick M.D.  On: 03/15/2013 16:29  10/27/13- 21 yoF former smoker referred courtesy of Dr Linna Darner; chronic rhinitis and bronchitis, complicated by scoliosis ACUTE VISIT: stuffy nose, deep dry cough; has recently been around sick grandkids -they had strep and deep cough.3 weeks ago visit in [place]. Had some cough  but did not feel sick. When exposed to sick grandchildren then 4 days later had increased coughing. Yesterday started rhinorrhea. Never had fever, sore throat, swollen glands, myalgias or GI upset.  04/26/14- 63 yoF former smoker followed for chronic rhinitis and bronchitis, complicated by scoliosis, hx bee sting anaphylaxis/ Epipen FOLLOWS [id number] to have cough(pt states this is normal for her) and gets better through out the day. Has restarted Zyrtec which helps cough. Bad bronchitis episode in October responded to Z pak. Discussed hx bee sting/ YJ. Has Epipen.   10/26/14- 61 yoF former smoker followed for chronic rhinitis and bronchitis, complicated by scoliosis, hx bee sting anaphylaxis/ Epipen Follows for:Pt. is feeling better still has a cough. drainage down the back of the throat. Dymista nasal spray has worked well but too expensive. Still feeling top down drip area daily Zyrtec. Swims regularly. CXR 04/26/14 image reviewed with her- note marked scoliosis IMPRESSION: COPD. There is no evidence of pneumonia. New atelectasis versus scarring is present in the lower left lung inferiorly. Electronically Signed  By: David Martinique  On: 04/26/2014 11:46  12/14/2015-65 year old female former smoker followed for chronic rhinitis, bronchitis, complicated by scoliosis, history bee sting anaphylaxis/EpiPen FOLLOWS FOR:Pt states her allergies have not been great; recently getting over cold she got from her grandkids; started back on Zyrtec QD. She is getting over a cold she recently caught from her grandchild. Had been aware of more postnasal drainage starting this spring, without headache or purulent nasal discharge. Little cough or wheeze. She had forgotten about azelastine nasal spray and asks refill, but Zyrtec does help. She expresses concern about an extensive family history of cancers, including 2 brothers who died of lung cancer. She is going for genetic counseling.  ROS-see  HPI Constitutional:  No-   weight loss, night sweats, fevers, chills, fatigue, lassitude. HEENT:   No-  headaches, difficulty swallowing, tooth/dental problems, sore throat,       No-  sneezing, itching, ear ache, nasal congestion, +post nasal drip,  CV:  No-   chest pain, orthopnea, PND, swelling in lower extremities, anasarca, dizziness, palpitations Resp: + shortness of breath with exertion or at rest.              No-  productive cough, + non-productive cough,  No- coughing up of blood.              No-   change in color of mucus.  No- wheezing.   Skin: No-   rash or lesions. GI:  + heartburn, indigestion, no-abdominal pain, nausea, vomiting,  GU:  MS:  No-   joint pain or swelling. .  + back pain. Neuro-     nothing unusual Psych:  No- change in mood or affect. No depression or anxiety.  No memory loss.  OBJ- Physical Exam General- Alert, Oriented, Affect-appropriate, Distress- none acute Skin- rash-none, lesions- none, excoriation- none Lymphadenopathy- none Head- atraumatic            Eyes- Gross vision intact, PERRLA, conjunctivae and secretions clear            Ears- Hearing, canals-normal            Nose- sniffing-none, +Septal dev, no-mucus, polyps, erosion, perforation             Throat- Mallampati II , mucosa clear , drainage- none, tonsils- atrophic Neck- flexible , trachea midline, no stridor , thyroid nl, carotid no bruit Chest - symmetrical excursion , unlabored           Heart/CV- RRR , no murmur , no gallop  , no rub, nl s1 s2                           - JVD- none , edema- none, stasis changes- none, varices- none           Lung- clear to P&A, wheeze- none, cough-none , dullness-none, rub- none           Chest wall- +scoliosis Abd-  Br/ Gen/ Rectal- Not done, not indicated Extrem- cyanosis- none, clubbing, none, atrophy- none, strength- nl Neuro- grossly intact to observation

## 2015-12-14 NOTE — Assessment & Plan Note (Signed)
She doesn't recognize cough or wheeze now and has not needed to use an inhaler. We can watch this. Plan-chest x-ray

## 2015-12-14 NOTE — Assessment & Plan Note (Signed)
Chronic awareness of postnasal drip without much sneeze and without symptoms of sinus infection. Recent incidental cold is resolving. Plan-occasional antihistamine appropriate. She may not really need more than that. Watch for exacerbations and triggers.

## 2015-12-14 NOTE — Patient Instructions (Signed)
Script sent for azelastine nasal spray for allergic nose if needed  Ok to use antihistamine as needed  Order- CXR dx   Acute bronchitis, former smoker  Please call as needed

## 2015-12-14 NOTE — Assessment & Plan Note (Signed)
No stings in a long time. She does keep an EpiPen available. Recent refill. We discussed availability of generic for cost advantage.

## 2015-12-18 DIAGNOSIS — M9903 Segmental and somatic dysfunction of lumbar region: Secondary | ICD-10-CM | POA: Diagnosis not present

## 2015-12-18 DIAGNOSIS — M545 Low back pain: Secondary | ICD-10-CM | POA: Diagnosis not present

## 2015-12-18 DIAGNOSIS — M9902 Segmental and somatic dysfunction of thoracic region: Secondary | ICD-10-CM | POA: Diagnosis not present

## 2015-12-18 DIAGNOSIS — M4124 Other idiopathic scoliosis, thoracic region: Secondary | ICD-10-CM | POA: Diagnosis not present

## 2015-12-20 ENCOUNTER — Telehealth: Payer: Self-pay | Admitting: Internal Medicine

## 2015-12-20 NOTE — Telephone Encounter (Signed)
Notes Recorded by Deneise Lever, MD on 12/14/2015 at 2:57 PM CXR-no abnormal markings or areas of concern in the lungs ----------------------------- Spoke with pt, aware of results/recs.  Nothing further needed.

## 2015-12-31 ENCOUNTER — Other Ambulatory Visit: Payer: Medicare Other

## 2015-12-31 ENCOUNTER — Encounter: Payer: Medicare Other | Admitting: Genetic Counselor

## 2016-01-07 ENCOUNTER — Ambulatory Visit (HOSPITAL_BASED_OUTPATIENT_CLINIC_OR_DEPARTMENT_OTHER): Payer: Medicare Other | Admitting: Genetic Counselor

## 2016-01-07 ENCOUNTER — Encounter: Payer: Self-pay | Admitting: Genetic Counselor

## 2016-01-07 ENCOUNTER — Other Ambulatory Visit: Payer: Medicare Other

## 2016-01-07 DIAGNOSIS — Z803 Family history of malignant neoplasm of breast: Secondary | ICD-10-CM

## 2016-01-07 DIAGNOSIS — Z86010 Personal history of colonic polyps: Secondary | ICD-10-CM | POA: Diagnosis not present

## 2016-01-07 DIAGNOSIS — Z8042 Family history of malignant neoplasm of prostate: Secondary | ICD-10-CM | POA: Insufficient documentation

## 2016-01-07 DIAGNOSIS — Z315 Encounter for genetic counseling: Secondary | ICD-10-CM | POA: Diagnosis present

## 2016-01-07 NOTE — Progress Notes (Signed)
REFERRING PROVIDER: Josetta Huddle, MD [address] [place], [place] [address]  PRIMARY PROVIDER:  Henrine Screws, MD  PRIMARY REASON FOR VISIT:  1. Family history of breast cancer   2. Family history of prostate cancer      HISTORY OF PRESENT ILLNESS:   Megan Downs, a 65 y.o. female, was seen for a [place] cancer genetics consultation at the request of Dr. Inda Merlin due to a family history of cancer.  Megan Downs presents to clinic today to discuss the possibility of a hereditary predisposition to cancer, genetic testing, and to further clarify her future cancer risks, as well as potential cancer risks for family members. Megan Downs is a 65 y.o. female with no personal history of cancer.  She reports that so many of her family members have been diagnosed with cancer that she feels like she is running from a black cloud.  She is here to learn about genetic testing.  CANCER HISTORY:   No history exists.     HORMONAL RISK FACTORS:  Menarche was at age 70.  First live birth at age 35.  OCP use for approximately 0 years.  Ovaries intact: yes.  Hysterectomy: no.  Menopausal status: postmenopausal.  HRT use: 0 years. Colonoscopy: yes; 2 TAs. Mammogram within the last year: yes. Number of breast biopsies: 0. Up to date with pelvic exams:  yes. Any excessive radiation exposure in the past:  no  Past Medical History:  Diagnosis Date  . Allergy    Environmental   . 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 Family history of breast cancer   . Family history of prostate cancer   . GERD (gastroesophageal reflux disease)   . Hyperlipidemia   . Hypertension   . Medullary sponge kidney   . MVP (mitral valve prolapse)    Dr Leonia Reeves ,Cardiology  stated no SBE prophylaxis  . Osteopenia     Past Surgical History:  Procedure Laterality Date  . COLONOSCOPY WITH PROPOFOL N/A 07/23/2015   Procedure: COLONOSCOPY WITH PROPOFOL;  Surgeon: Garlan Fair, MD;  Location: [facility];  Service: Endoscopy;  Laterality: N/A;  . CYSTOSCOPY  2004    for anormal kineys;Dr Nesi  . HYSTEROSCOPY WITH RESECTOSCOPE  2012   eval secondary to PMB with path benign  . TOE SURGERY Right 2009   great toe, cleaning joint  . TONSILLECTOMY      Social History   Social History  . Marital status: Married    Spouse name: N/A  . Number of children: 2  . Years of education: N/A   Occupational History  . SMALL BUSINESS OWNER [employer]   Social History Main Topics  . Smoking status: Former Smoker    Packs/day: 1.00    Years: 2.00    Types: Cigarettes    Quit date: 06/09/1972  . Smokeless tobacco: Never Used     Comment: Smoked [phone], up to 1 ppd. Second hand smoke growing up  . Alcohol use 1.8 oz/week    3 Glasses of wine per week  . Drug use: No  . Sexual activity: Yes    Birth control/ protection: None   Other Topics Concern  . None   Social History Narrative   REGULAR EXERCISE     FAMILY HISTORY:  We obtained a detailed, 4-generation family history.  Significant diagnoses are listed below: Family History  Problem Relation Age of Onset  . Breast cancer Mother 71  .  Cancer Father     esophageal cancer  . Prostate cancer Brother   . Multiple sclerosis Brother   . Lung cancer Brother     2 brothers  . Lung cancer Brother   . Heart attack Maternal Grandfather     in 20s  . Heart attack Maternal Grandmother     in 9s  . Heart failure Maternal Grandmother 76  . Kidney failure Paternal Grandmother   . Heart failure Paternal Grandfather     in 76s  . Breast cancer Cousin     maternal first cousin dx in her 34s-60s  . Asthma Neg Hx   . COPD Neg Hx   . Diabetes Neg Hx     The patient has two daughters who are cancer free.  She had three brothers.  Two brothers died of lung cancer after being smokers, and one brother has M.S. and prostate cancer.  The patient's mother was diagnosed with breast cancer at 52 and died  at 64.  She had three sisters who were all cancer free, but one sister had a daughter with breast cancer.  The patient's father died of esophageal cancer.  He was an only child.  There is no other reported family history of cancer.  Patient's maternal ancestors are of Caucasian descent, and paternal ancestors are of English descent. There is no reported Ashkenazi Jewish ancestry. There is no known consanguinity.  GENETIC COUNSELING ASSESSMENT: Megan Downs is a 65 y.o. female with a family history of cancer which is somewhat suggestive of a hereditary cancer syndrome and predisposition to cancer. We, therefore, discussed and recommended the following at today's visit.   DISCUSSION: We discussed that about 5-10% of breast cancer is hereditary with most cases due to BRCA mutations.  We discussed that based on her family history, it would be unlikely to have a BRCA mutation running in the family.  If anything it would most likely be a moderate risk gene.  We reviewed some of the moderate risk genes including PALB2, ATM and CHEK2.  We reviewed the characteristics, features and inheritance patterns of hereditary cancer syndromes. We also discussed genetic testing, including the appropriate family members to test, the process of testing, insurance coverage and turn-around-time for results. We discussed the implications of a negative, positive and/or variant of uncertain significant result. We recommended Megan Downs pursue genetic testing for the Breast/Ovarian cancer gene panel.   Based on Megan Downs's family history of cancer, she meets NCCN criteria for genetic testing, however, she has Medicare.  Medicare typically does not cover genetic testing for individuals who do not have cancer.  We discussed that there is apossibility that her supplimental insurace could cover testing.  We also discussed OOP testing.  There are a couple labs that offer $250-260 tests for patients who want to pay OOP. We discussed that  we could send in testing and based on the billing investigation, if her out of pocket cost for testing is over $100, the laboratory will call and confirm whether she wants to proceed with testing. IF she receives a call, we could reconsider other laboratories. If the out of pocket cost of testing is less than $100 she will be billed by the genetic testing laboratory.   IBased on the patient's personal and family history, statistical models (Tyrer Cusik)  and literature data were used to estimate her risk of developing breast cancer. These estimate her lifetime risk of developing breast cancer to be approximately 13.5%. This estimation does  not take into account any genetic testing results.  The patient's lifetime breast cancer risk is a preliminary estimate based on available information using one of several models endorsed by the [place] (ACS). The ACS recommends consideration of breast MRI screening as an adjunct to mammography for patients at high risk (defined as 20% or greater lifetime risk). A more detailed breast cancer risk assessment can be considered, if clinically indicated.   The patient discussed that this was helpful for her to put her risk into context.  She had assumed that her risk for breast cancer was much higher and that she was a ticking time bomb.  She is interested in pursuing genetic testing to determine if she needs to manage her care differently.  PLAN: After considering the risks, benefits, and limitations, Megan Downs  provided informed consent to pursue genetic testing and the blood sample was sent to [facility] for analysis of the Breast/Ovarian cancer panel. Results should be available within approximately 2-3 weeks' time, at which point they will be disclosed by telephone to Megan Downs, as will any additional recommendations warranted by these results. Megan Downs will receive a summary of her genetic counseling visit and a copy of her results once available.  This information will also be available in Epic. We encouraged Megan Downs to remain in contact with cancer genetics annually so that we can continuously update the family history and inform her of any changes in cancer genetics and testing that may be of benefit for her family. Megan Downs questions were answered to her satisfaction today. Our contact information was provided should additional questions or concerns arise.  Lastly, we encouraged Megan Downs to remain in contact with cancer genetics annually so that we can continuously update the family history and inform her of any changes in cancer genetics and testing that may be of benefit for this family.   Ms.  Downs questions were answered to her satisfaction today. Our contact information was provided should additional questions or concerns arise. Thank you for the referral and allowing [place] to share in the care of your patient.   Nichole Keltner P. Florene Glen, [place], [facility] Certified Genetic Counselor [email or web address] phone: [phone]  The patient was seen for a total of 45 minutes in face-to-face genetic counseling.  This patient was discussed with Drs. Magrinat, Lindi Adie and/or Burr Medico who agrees with the above.    _______________________________________________________________________ For Office Staff:  Number of people involved in session: 1 Was an Intern/ student involved with case: no

## 2016-01-08 DIAGNOSIS — M542 Cervicalgia: Secondary | ICD-10-CM | POA: Diagnosis not present

## 2016-01-08 DIAGNOSIS — M9901 Segmental and somatic dysfunction of cervical region: Secondary | ICD-10-CM | POA: Diagnosis not present

## 2016-01-21 DIAGNOSIS — M546 Pain in thoracic spine: Secondary | ICD-10-CM | POA: Diagnosis not present

## 2016-01-21 DIAGNOSIS — M9902 Segmental and somatic dysfunction of thoracic region: Secondary | ICD-10-CM | POA: Diagnosis not present

## 2016-01-22 ENCOUNTER — Ambulatory Visit: Payer: Self-pay | Admitting: Genetic Counselor

## 2016-01-22 ENCOUNTER — Telehealth: Payer: Self-pay | Admitting: Genetic Counselor

## 2016-01-22 DIAGNOSIS — Z803 Family history of malignant neoplasm of breast: Secondary | ICD-10-CM

## 2016-01-22 DIAGNOSIS — Z1379 Encounter for other screening for genetic and chromosomal anomalies: Secondary | ICD-10-CM

## 2016-01-22 DIAGNOSIS — Z8042 Family history of malignant neoplasm of prostate: Secondary | ICD-10-CM

## 2016-01-22 NOTE — Telephone Encounter (Signed)
Discussed that genetic testing was negative for any deleterious mutations in the genes we looked at.  Discussed that there could be a hereditary cancer syndrome running in the family and that she did not inherit it.  Discussed having her brother, who had prostate cancer, get testing to see if he may test positive for something that patient is negative for.

## 2016-01-22 NOTE — Progress Notes (Signed)
HPI: Ms. Waage was previously seen in the [facility] clinic due to a family history of cancer and concerns regarding a hereditary predisposition to cancer. Please refer to our prior cancer genetics clinic note for more information regarding Ms. Fazzini's medical, social and family histories, and our assessment and recommendations, at the time. Ms. Enneking recent genetic test results were disclosed to her, as were recommendations warranted by these results. These results and recommendations are discussed in more detail below.  FAMILY HISTORY:  We obtained a detailed, 4-generation family history.  Significant diagnoses are listed below: Family History  Problem Relation Age of Onset  . Breast cancer Mother 3  . Cancer Father     esophageal cancer  . Prostate cancer Brother   . Multiple sclerosis Brother   . Lung cancer Brother     2 brothers  . Lung cancer Brother   . Heart attack Maternal Grandfather     in 1s  . Heart attack Maternal Grandmother     in 68s  . Heart failure Maternal Grandmother 76  . Kidney failure Paternal Grandmother   . Heart failure Paternal Grandfather     in 53s  . Breast cancer Cousin     maternal first cousin dx in her 71s-60s  . Asthma Neg Hx   . COPD Neg Hx   . Diabetes Neg Hx     The patient has two daughters who are cancer free.  She had three brothers.  Two brothers died of lung cancer after being smokers, and one brother has M.S. and prostate cancer.  The patient's mother was diagnosed with breast cancer at 66 and died at 107.  She had three sisters who were all cancer free, but one sister had a daughter with breast cancer.  The patient's father died of esophageal cancer.  He was an only child.  There is no other reported family history of cancer.  Patient's maternal ancestors are of Caucasian descent, and paternal ancestors are of English descent. There is no reported Ashkenazi Jewish ancestry. There is no known consanguinity.  GENETIC  TEST RESULTS: At the time of Ms. Siefert's visit, we recommended she pursue genetic testing of the Breast/Ovarian cancer gene panel. The Breast/Ovarian gene panel offered by [facility] includes sequencing and rearrangement analysis for the following 20 genes:  ATM, BARD1, BRCA1, BRCA2, BRIP1, CDH1, CHEK2, EPCAM, FANCC, MLH1, MSH2, MSH6, NBN, PALB2, PMS2, PTEN, RAD51C, RAD51D, TP53, and XRCC2.   The report date is January 21, 2016.  Genetic testing was normal, and did not reveal a deleterious mutation in these genes. The test report has been scanned into EPIC and is located under the Molecular Pathology section of the Results Review tab.   We discussed with Ms. Hurd that since the current genetic testing is not perfect, it is possible there may be a gene mutation in one of these genes that current testing cannot detect, but that chance is small. We also discussed, that it is possible that another gene that has not yet been discovered, or that we have not yet tested, is responsible for the cancer diagnoses in the family, and it is, therefore, important to remain in touch with cancer genetics in the future so that we can continue to offer Ms. Brownstein the most up to date genetic testing.   CANCER SCREENING RECOMMENDATIONS: This normal result is reassuring and indicates that Ms. Sox does not likely have an increased risk of cancer due to a mutation in one of these  genes.  We, therefore, recommended  Ms. Cegielski continue to follow the cancer screening guidelines provided by her primary healthcare providers.   RECOMMENDATIONS FOR FAMILY MEMBERS: Women in this family might be at some increased risk of developing cancer, over the general population risk, simply due to the family history of cancer. We recommended women in this family have a yearly mammogram beginning at age 73, or 2 years younger than the earliest onset of cancer, an an annual clinical breast exam, and perform monthly breast self-exams. Women in this  family should also have a gynecological exam as recommended by their primary provider. All family members should have a colonoscopy by age 37.  Based on Ms. Denapoli's family history, we recommended her brother, Shanon Brow, who was diagnosed with prostate cancer at age 50, have genetic counseling and testing. Ms. Lafountain will let us know if we can be of any assistance in coordinating genetic counseling and/or testing for this family member.   FOLLOW-UP: Lastly, we discussed with Ms. Clagg that cancer genetics is a rapidly advancing field and it is possible that new genetic tests will be appropriate for her and/or her family members in the future. We encouraged her to remain in contact with cancer genetics on an annual basis so we can update her personal and family histories and let her know of advances in cancer genetics that may benefit this family.   Our contact number was provided. Ms. Daily questions were answered to her satisfaction, and she knows she is welcome to call us at anytime with additional questions or concerns.   Roma Kayser, MS, [facility] Certified Genetic Counselor [email or web address]

## 2016-01-22 NOTE — Telephone Encounter (Signed)
LM on VM with good news on testing.  Asked that she CB and left instructions.

## 2016-02-06 DIAGNOSIS — E782 Mixed hyperlipidemia: Secondary | ICD-10-CM | POA: Diagnosis not present

## 2016-02-06 DIAGNOSIS — R011 Cardiac murmur, unspecified: Secondary | ICD-10-CM | POA: Diagnosis not present

## 2016-02-06 DIAGNOSIS — F419 Anxiety disorder, unspecified: Secondary | ICD-10-CM | POA: Diagnosis not present

## 2016-02-06 DIAGNOSIS — J309 Allergic rhinitis, unspecified: Secondary | ICD-10-CM | POA: Diagnosis not present

## 2016-02-06 DIAGNOSIS — Z23 Encounter for immunization: Secondary | ICD-10-CM | POA: Diagnosis not present

## 2016-02-06 DIAGNOSIS — M419 Scoliosis, unspecified: Secondary | ICD-10-CM | POA: Diagnosis not present

## 2016-02-06 DIAGNOSIS — I10 Essential (primary) hypertension: Secondary | ICD-10-CM | POA: Diagnosis not present

## 2016-02-06 DIAGNOSIS — Z809 Family history of malignant neoplasm, unspecified: Secondary | ICD-10-CM | POA: Diagnosis not present

## 2016-02-06 DIAGNOSIS — Z0001 Encounter for general adult medical examination with abnormal findings: Secondary | ICD-10-CM | POA: Diagnosis not present

## 2016-02-06 DIAGNOSIS — Z86010 Personal history of colonic polyps: Secondary | ICD-10-CM | POA: Diagnosis not present

## 2016-02-13 DIAGNOSIS — R350 Frequency of micturition: Secondary | ICD-10-CM | POA: Diagnosis not present

## 2016-03-05 DIAGNOSIS — M79675 Pain in left toe(s): Secondary | ICD-10-CM | POA: Diagnosis not present

## 2016-04-15 DIAGNOSIS — D3000 Benign neoplasm of unspecified kidney: Secondary | ICD-10-CM | POA: Diagnosis not present

## 2016-04-15 DIAGNOSIS — R311 Benign essential microscopic hematuria: Secondary | ICD-10-CM | POA: Diagnosis not present

## 2016-04-16 DIAGNOSIS — Z1231 Encounter for screening mammogram for malignant neoplasm of breast: Secondary | ICD-10-CM | POA: Diagnosis not present

## 2016-04-16 DIAGNOSIS — Z803 Family history of malignant neoplasm of breast: Secondary | ICD-10-CM | POA: Diagnosis not present

## 2016-04-18 ENCOUNTER — Encounter (HOSPITAL_COMMUNITY): Payer: Self-pay

## 2016-07-27 IMAGING — DX DG CHEST 2V
2 series · 2 of 2 positions shown · non-contrast
Comparison: PA and lateral chest x-ray April 26, 2014

CLINICAL DATA: Symptoms of acute bronchitis ; former smoker ;
history of chronic bronchitic change.

EXAM:
CHEST  2 VIEW

[chest pa]
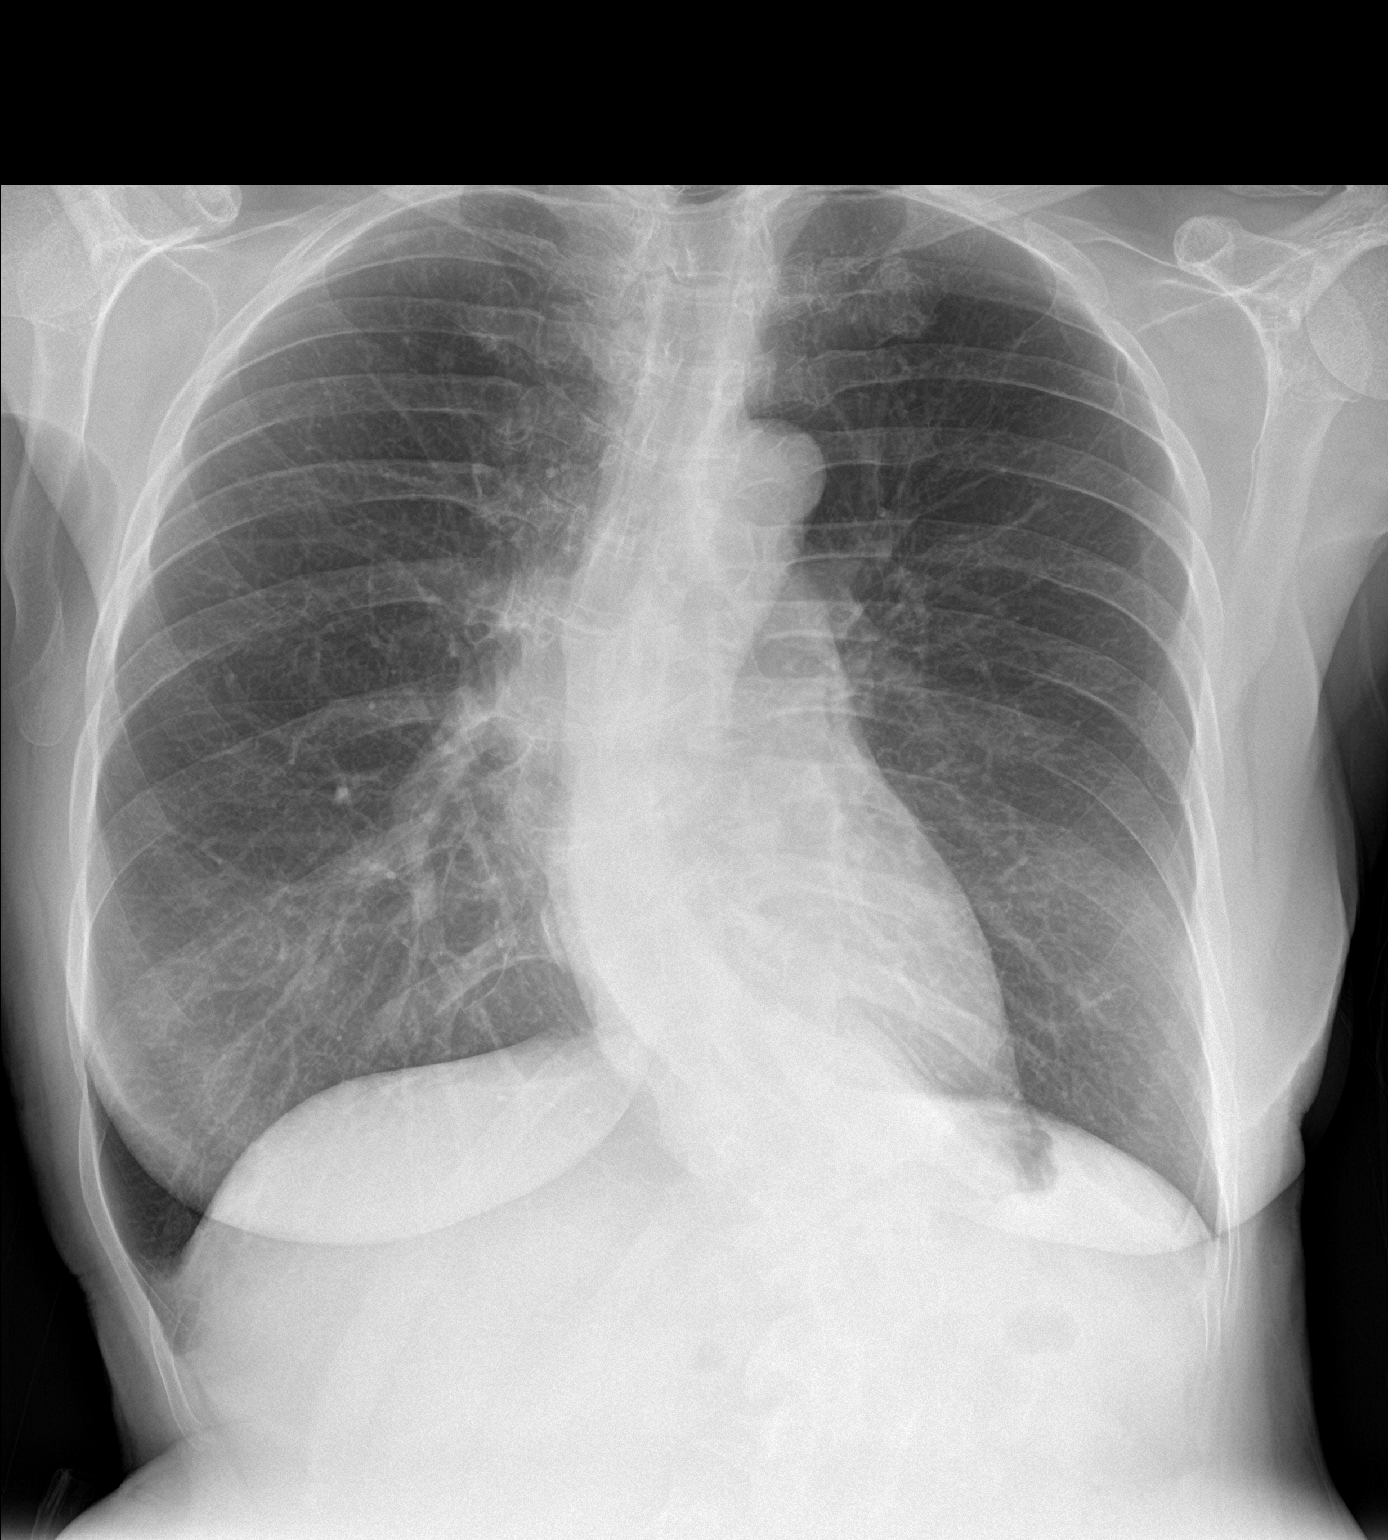

[chest lat]
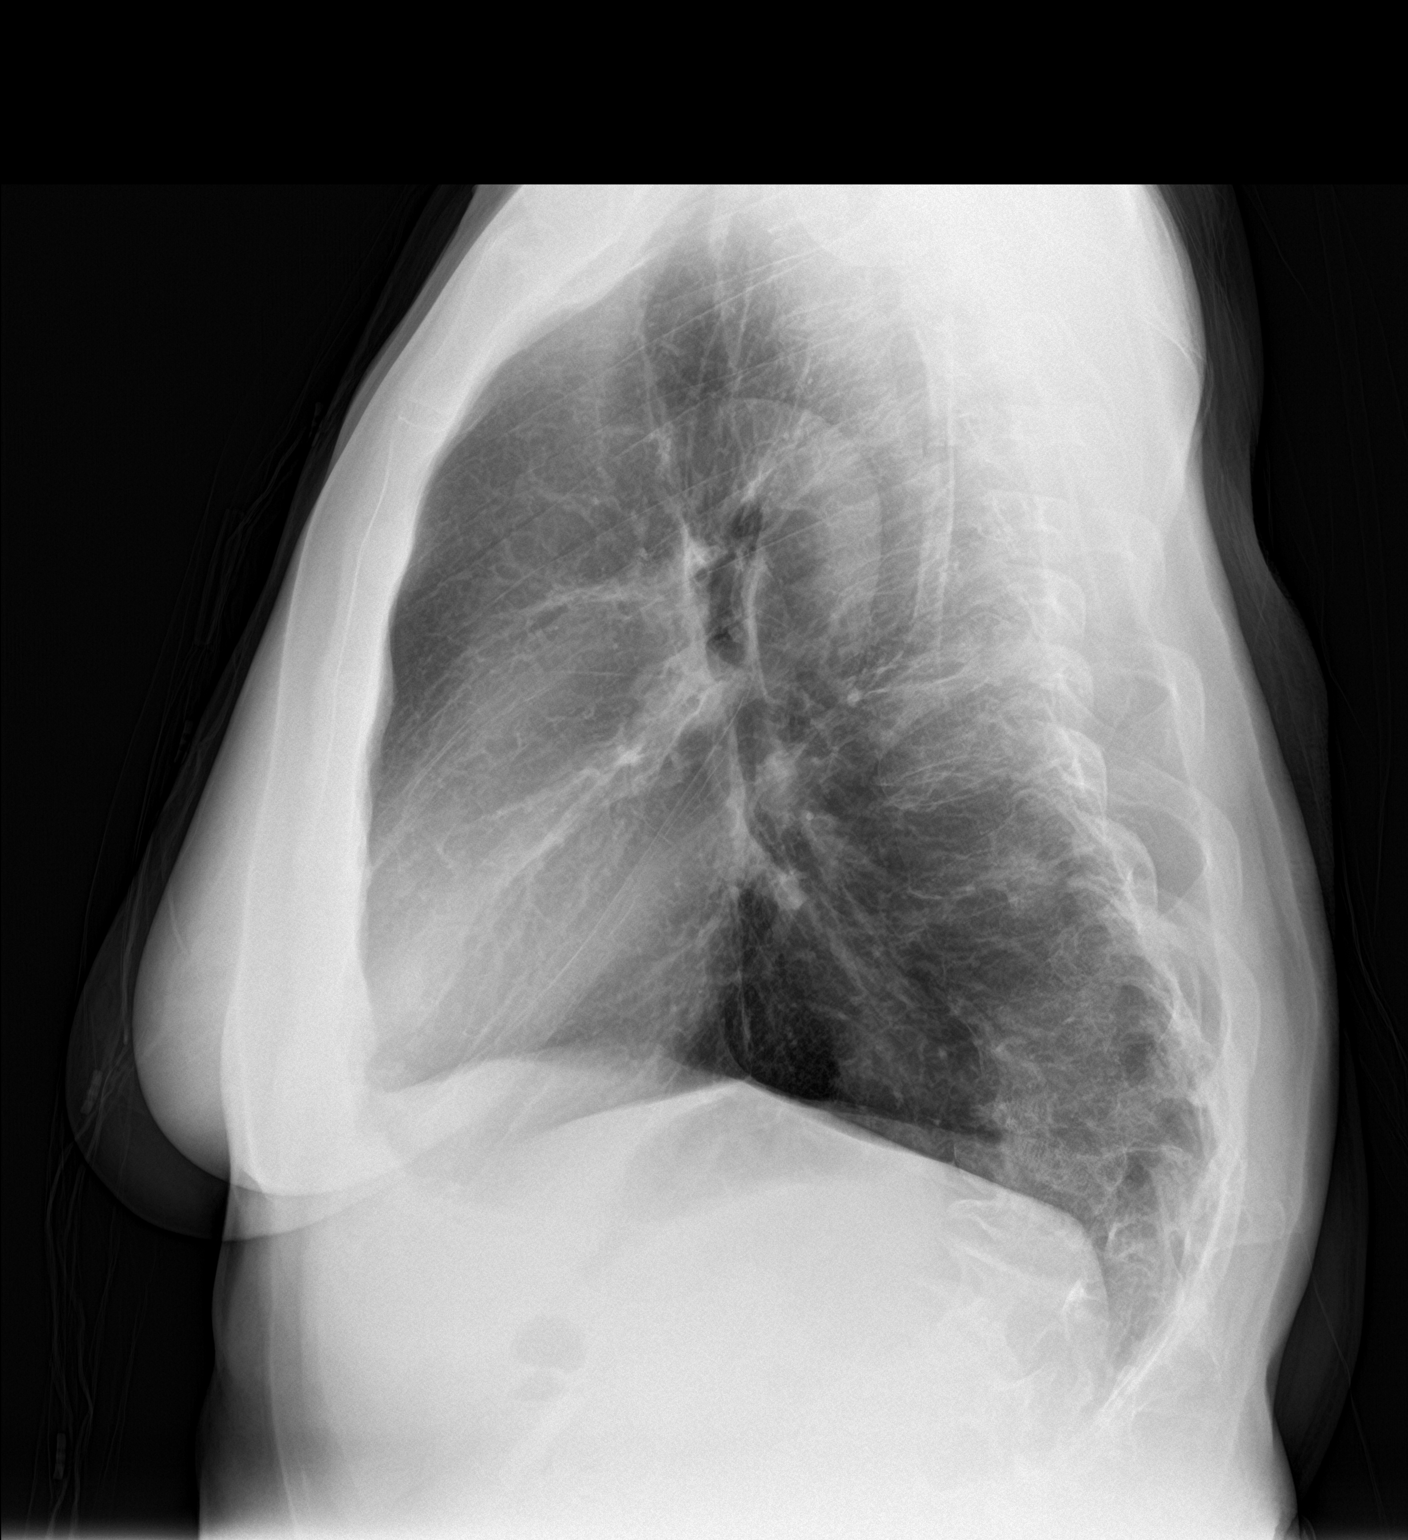

[2 of 2 positions shown; findings below may reference images not displayed]

FINDINGS: The lungs are mildly hyperinflated. There is no focal infiltrate.
There is no pleural effusion. The heart and pulmonary vascularity
are normal. The mediastinum is normal in width. There is marked S
shaped thoracolumbar scoliosis which does mildly distort the
thoracic cage.
IMPRESSION: Hyperinflation consistent with chronic bronchitis. There is no
alveolar pneumonia nor CHF.

## 2016-08-05 DIAGNOSIS — M546 Pain in thoracic spine: Secondary | ICD-10-CM | POA: Diagnosis not present

## 2016-08-05 DIAGNOSIS — M9903 Segmental and somatic dysfunction of lumbar region: Secondary | ICD-10-CM | POA: Diagnosis not present

## 2016-08-05 DIAGNOSIS — M9902 Segmental and somatic dysfunction of thoracic region: Secondary | ICD-10-CM | POA: Diagnosis not present

## 2016-08-05 DIAGNOSIS — M545 Low back pain: Secondary | ICD-10-CM | POA: Diagnosis not present

## 2016-08-18 DIAGNOSIS — Z23 Encounter for immunization: Secondary | ICD-10-CM | POA: Diagnosis not present

## 2016-08-18 DIAGNOSIS — J984 Other disorders of lung: Secondary | ICD-10-CM | POA: Diagnosis not present

## 2016-08-18 DIAGNOSIS — Z780 Asymptomatic menopausal state: Secondary | ICD-10-CM | POA: Diagnosis not present

## 2016-08-18 DIAGNOSIS — F419 Anxiety disorder, unspecified: Secondary | ICD-10-CM | POA: Diagnosis not present

## 2016-11-17 DIAGNOSIS — H2513 Age-related nuclear cataract, bilateral: Secondary | ICD-10-CM | POA: Diagnosis not present

## 2016-11-17 DIAGNOSIS — H35363 Drusen (degenerative) of macula, bilateral: Secondary | ICD-10-CM | POA: Diagnosis not present

## 2017-04-10 DIAGNOSIS — Z23 Encounter for immunization: Secondary | ICD-10-CM | POA: Diagnosis not present

## 2017-05-14 DIAGNOSIS — R030 Elevated blood-pressure reading, without diagnosis of hypertension: Secondary | ICD-10-CM | POA: Diagnosis not present

## 2017-05-14 DIAGNOSIS — Z124 Encounter for screening for malignant neoplasm of cervix: Secondary | ICD-10-CM | POA: Diagnosis not present

## 2017-05-14 DIAGNOSIS — Z1272 Encounter for screening for malignant neoplasm of vagina: Secondary | ICD-10-CM | POA: Diagnosis not present

## 2017-05-14 DIAGNOSIS — Z1389 Encounter for screening for other disorder: Secondary | ICD-10-CM | POA: Diagnosis not present

## 2017-05-14 DIAGNOSIS — Z1231 Encounter for screening mammogram for malignant neoplasm of breast: Secondary | ICD-10-CM | POA: Diagnosis not present

## 2017-05-14 DIAGNOSIS — Z23 Encounter for immunization: Secondary | ICD-10-CM | POA: Diagnosis not present

## 2017-05-14 DIAGNOSIS — Z6824 Body mass index (BMI) 24.0-24.9, adult: Secondary | ICD-10-CM | POA: Diagnosis not present

## 2017-05-14 DIAGNOSIS — Z136 Encounter for screening for cardiovascular disorders: Secondary | ICD-10-CM | POA: Diagnosis not present

## 2017-05-14 DIAGNOSIS — Z13220 Encounter for screening for lipoid disorders: Secondary | ICD-10-CM | POA: Diagnosis not present

## 2017-05-14 DIAGNOSIS — Z0000 Encounter for general adult medical examination without abnormal findings: Secondary | ICD-10-CM | POA: Diagnosis not present

## 2017-06-08 DIAGNOSIS — R0602 Shortness of breath: Secondary | ICD-10-CM | POA: Diagnosis not present

## 2017-06-08 DIAGNOSIS — R002 Palpitations: Secondary | ICD-10-CM | POA: Diagnosis not present

## 2017-06-08 DIAGNOSIS — R079 Chest pain, unspecified: Secondary | ICD-10-CM | POA: Diagnosis not present

## 2017-07-02 DIAGNOSIS — I340 Nonrheumatic mitral (valve) insufficiency: Secondary | ICD-10-CM | POA: Diagnosis not present

## 2017-07-02 DIAGNOSIS — R002 Palpitations: Secondary | ICD-10-CM | POA: Diagnosis not present

## 2017-07-03 DIAGNOSIS — M791 Myalgia, unspecified site: Secondary | ICD-10-CM | POA: Diagnosis not present

## 2017-08-06 DIAGNOSIS — M4130 Thoracogenic scoliosis, site unspecified: Secondary | ICD-10-CM | POA: Diagnosis not present

## 2017-08-06 DIAGNOSIS — M546 Pain in thoracic spine: Secondary | ICD-10-CM | POA: Diagnosis not present

## 2017-08-25 DIAGNOSIS — M791 Myalgia, unspecified site: Secondary | ICD-10-CM | POA: Diagnosis not present

## 2017-09-16 DIAGNOSIS — M47814 Spondylosis without myelopathy or radiculopathy, thoracic region: Secondary | ICD-10-CM | POA: Diagnosis not present

## 2017-09-16 DIAGNOSIS — M5134 Other intervertebral disc degeneration, thoracic region: Secondary | ICD-10-CM | POA: Diagnosis not present

## 2019-05-27 ENCOUNTER — Encounter: Attending: Clinical Neurophysiology | Primary: Family Medicine

## 2019-06-13 ENCOUNTER — Ambulatory Visit: Attending: Clinical Neurophysiology | Primary: Family Medicine

## 2019-06-13 ENCOUNTER — Ambulatory Visit
Admit: 2019-06-13 | Discharge: 2019-06-13 | Payer: MEDICARE | Attending: Clinical Neurophysiology | Primary: Family Medicine

## 2019-06-13 DIAGNOSIS — R200 Anesthesia of skin: Secondary | ICD-10-CM

## 2019-06-13 NOTE — Progress Notes (Signed)
[facility]  [address]  [place], [place] [address]         Electromyogram and Nerve Conduction Study Procedure Note        Hx: 69 y.o. right handed female with complaint of tingling burning in front things. Some LBP. No weakness. But c/o walking like an old lady. Symptoms have been present for 2 years partially relieved by taking Cymbalta. No DM.     Review of system, copied from a note from [facility] April 13, 2019. Neurological- disturbances in coordination, falling down, numbness, poor balance, tingling, tremor and weakness.  Musculoskeletal- back pain, muscle aches, cramps, weakness and stiffness.  Psychiatric: Anxiety.    Imaging study, spine MRI was done at orthopedic service, reported as severe scoliosis with advanced degenerative disc disease in thoracic and lumbar spine.        Exam: Alert and coherent with normal speech.  Central and peripheral visual fields normal, extraocular movement normal, facial movement symmetrical, tongue protrusion in the middle.  Motor power was normal 5/5 bilateral upper and lower limbs. There was no atrophy, no fasciculations. Deep tendon reflexes were present and symmetrical at 2+. Sensory exam was normal. Gait was not evaluated in detail.         Interpretation:  Conventional nerve conduction studies were performed on the right lower extremity and left lower extremity.  Bilateral sural nerve and plantar nerves were normal.  Bilateral peroneal and tibial motor nerves were normal.  F-wave latencies were normal.  H responses were normal 29.11 MS on the left and 30.52 MS on the right..        Needle electromyography was performed on the right lower extremity and lumbar paraspinal muscles. No meaningful abnormalities were found.        Conclusion: Normal conduction study of bilateral lower limb showing no evidence of mono or polyneuropathy.      Needle EMG to the right lower limb and paraspinal muscles was normal  showing no evidence of femoral/sciatic neuropathy, L2/3/4/5/S1 radiculopathy or plexopathy.  No myopathic process.    Patient probably has meralgia paresthetica.  She will return for skin biopsy to look for small fiber neuropathy.  Examination did not show upper motor neuron sign.        Procedure Details: Under procedure category

## 2019-06-13 NOTE — Progress Notes (Addendum)
[facility]  [address]  [place], [place] [address]         Electromyogram and Nerve Conduction Study Procedure Note        Hx: 69 y.o. right handed female with complaint of tingling burning in front things. Some LBP. No weakness. But c/o walking like an old lady. Symptoms have been present for 2 years partially relieved by taking Cymbalta. No DM.     Review of system, copied from a note from [facility] April 13, 2019. Neurological- disturbances in coordination, falling down, numbness, poor balance, tingling, tremor and weakness.  Musculoskeletal- back pain, muscle aches, cramps, weakness and stiffness.  Psychiatric: Anxiety.    Imaging study, spine MRI was done at orthopedic service, reported as severe scoliosis with advanced degenerative disc disease in thoracic and lumbar spine.        Exam: Alert and coherent with normal speech.  Central and peripheral visual fields normal, extraocular movement normal, facial movement symmetrical, tongue protrusion in the middle.  Motor power was normal 5/5 bilateral upper and lower limbs. There was no atrophy, no fasciculations. Deep tendon reflexes were present and symmetrical at 2+. Sensory exam was normal. Gait was not evaluated in detail.         Interpretation:  Conventional nerve conduction studies were performed on the right lower extremity and left lower extremity.  Bilateral sural nerve and plantar nerves were normal.  Bilateral peroneal and tibial motor nerves were normal.  F-wave latencies were normal.  H responses were normal 29.11 MS on the left and 30.52 MS on the right..        Needle electromyography was performed on the right lower extremity and lumbar paraspinal muscles. No meaningful abnormalities were found.        Conclusion: Normal conduction study of bilateral lower limb showing no evidence of mono or polyneuropathy.       Needle EMG to the right lower limb and paraspinal muscles was normal showing no evidence of femoral/sciatic neuropathy, L2/3/4/5/S1 radiculopathy or plexopathy.  No myopathic process.    Patient probably has meralgia paresthetica.  She will return for skin biopsy to look for small fiber neuropathy.  Examination did not show upper motor neuron sign.        Procedure Details: Under procedure category

## 2019-07-15 ENCOUNTER — Ambulatory Visit: Attending: Clinical Neurophysiology | Primary: Family Medicine

## 2019-07-15 ENCOUNTER — Ambulatory Visit
Admit: 2019-07-15 | Discharge: 2019-07-15 | Payer: MEDICARE | Attending: Clinical Neurophysiology | Primary: Family Medicine

## 2019-07-15 DIAGNOSIS — R202 Paresthesia of skin: Secondary | ICD-10-CM

## 2019-07-15 NOTE — Progress Notes (Signed)
Skin Biopsy Procedure Note:       Indication: Bilateral lower limb dysesthesia, burning, numbness and tingling from front thigh all the way down to feet.  Nerve conduction study EMG were normal.       Consent: Written consent was completed        The patient was placed on an examining table to the left side. The biopsy 3 sites of the right lower limb were all steriled with Betadine swabs x 3, and then alcohol patch x 1. The first site was at right foot above the EDB; the second site was at the same side of lateral lower leg, 10 cm above the lateral malleolus. The third site in the right lower portion of front thigh, 7 cm above the upper edge of knee cap. 2% Lidocaine was injected intra and subcutaneously in V shape on all sites. The biopsy punch and other instrument were provided by [employer]. About 3-4 mm diameter and 4-5 mm depth of dermis/ epidermis was sampled on the above sites, and placed into the solution of a prepared test tube provided by [employer]. The label of each tube was correctly matched with the sample. Minimal bleeding was easily stopped by pressure with sterile 4 x 4, then the each biopsy site was covered with a wide Band-Aid.         Patient tolerated the procedure well.  Postprocedural instruction was given.  Patient was advised to call for any discomfort related to the procedure.

## 2019-07-15 NOTE — Progress Notes (Signed)
Skin Biopsy Procedure Note:       Indication: Bilateral lower limb dysesthesia, burning, numbness and tingling from front thigh all the way down to feet.  Nerve conduction study EMG were normal.       Consent: Written consent was completed        The patient was placed on an examining table to the left side. The biopsy 3 sites of the right lower limb were all steriled with Betadine swabs x 3, and then alcohol patch x 1. The first site was at right foot above the EDB; the second site was at the same side of lateral lower leg, 10 cm above the lateral malleolus. The third site in the right lower portion of front thigh, 7 cm above the upper edge of knee cap. 2% Lidocaine was injected intra and subcutaneously in V shape on all sites. The biopsy punch and other instrument were provided by [employer]. About 3-4 mm diameter and 4-5 mm depth of dermis/ epidermis was sampled on the above sites, and placed into the solution of a prepared test tube provided by [employer]. The label of each tube was correctly matched with the sample. Minimal bleeding was easily stopped by pressure with sterile 4 x 4, then the each biopsy site was covered with a wide Band-Aid.         Patient tolerated the procedure well.  Postprocedural instruction was given.  Patient was advised to call for any discomfort related to the procedure.

## 2019-07-25 NOTE — Progress Notes (Signed)
pls mail reports to pt, I will give you the originals to mail. thx

## 2019-07-25 NOTE — Progress Notes (Signed)
Brief appt to discuss skin bx result, she is my pt right?

## 2019-07-25 NOTE — Progress Notes (Signed)
pls mail reports to pt, I will give you the originals to mail. thx

## 2019-07-25 NOTE — Progress Notes (Signed)
Brief appt to discuss skin bx result, she is my pt right?

## 2019-07-27 NOTE — Telephone Encounter (Signed)
Called patient and left message for skin biopsy results, 1+ from thigh, calf borderline, foot negative.  Suggested to have the other side done, may add on wrist if symptoms involving hand.    Will need appointment virtual to discuss, also screen off autonomic symptoms.Marland Kitchen

## 2019-07-27 NOTE — Telephone Encounter (Signed)
Pt called regarding vm left regarding her biopsy.  The note stated that this would be discussed at a vv.  Offered pt next available vv in early March.  Pt refused and stated she wants a call back from the provider in the next 3 days to discuss.

## 2019-07-27 NOTE — Telephone Encounter (Signed)
May try add on Friday, vv

## 2019-07-27 NOTE — Telephone Encounter (Signed)
pls read my note today, I called, pt did not answer so I  left msg with result, she should check on her phone msg, if there is further question, will discuss on visit.

## 2019-07-28 ENCOUNTER — Telehealth: Attending: Clinical Neurophysiology | Primary: Family Medicine

## 2019-07-28 ENCOUNTER — Telehealth
Admit: 2019-07-28 | Discharge: 2019-07-28 | Payer: MEDICARE | Attending: Clinical Neurophysiology | Primary: Family Medicine

## 2019-07-28 DIAGNOSIS — G629 Polyneuropathy, unspecified: Secondary | ICD-10-CM

## 2019-07-28 NOTE — Progress Notes (Signed)
Notify pt will set up appt to discuss labs and skin bx 2nd part of reports.

## 2019-07-28 NOTE — Progress Notes (Signed)
Alexandria Klein is a 69 y.o. female, evaluated via audio-only technology on 07/28/2019 for No chief complaint on file.  .    Assessment & Plan:   Diagnoses and all orders for this visit:    1. Small fiber neuropathy  -     VITAMIN B12  -     SPEP AND IFE, SERUM  -     VITAMIN B6; Future  -     ANGIOTENSIN CONVERTING ENZYME    2. Myalgia  -     CK; Future  -     SED RATE (ESR)  -     ALDOLASE; Future    3.  Weakness of both legs    Will need second set of skin biopsy to finalize diagnosis because of unusual distribution. Discussed IVIG.     12  Subjective:   Discussed skin bx result, positive on thigh sample, borderline above the ankle, normal on foot. All on the right side. For burning sensation from thighs all way down to feet.     Muscle ache and tender to touch, stumbles, weak in legs, used to ice skate, play with grandchildren, not anymore, real effort to walk. Back pain chronic, but symptoms began suddenly, woke up with weakness of legs felt like ran 50 miles, following day developed soreness and burning, negative in arms. Symptoms persisted 2 years and 2 months now, variable severity. Recalled a few weeks prior to event she and husband had severe respiratory infection, he had similar onset of above but milder and went away.      Prior to Admission medications    Not on File     Past Medical History:   Diagnosis Date   ??? Dysesthesia 07/15/2019   ??? Paresthesia of both lower extremities 06/13/2019    thighs       Review of Systems   Musculoskeletal: Positive for back pain and myalgias.   Neurological: Positive for tingling, sensory change and weakness.       No flowsheet data found.     Alexandria Klein, who was evaluated through a patient-initiated, synchronous (real-time) audio only encounter, and/or her healthcare decision maker, is aware that it is a billable service, with coverage as determined by her insurance carrier. She provided verbal consent to proceed: Yes. She has not had a related appointment within my  department in the past 7 days or scheduled within the next 24 hours.      Total Time: minutes: 21-30 minutes    Oneta Rack, MD

## 2019-07-28 NOTE — Progress Notes (Signed)
Sorry, Alexandria Klein can forward to you, as time is very limited for me to send follow up and tests  to different persons.

## 2019-07-28 NOTE — Progress Notes (Signed)
Sorry, Alexandria Klein can forward to you, as time is very limited for me to send follow up and tests  to different persons.

## 2019-07-28 NOTE — Progress Notes (Signed)
Notify pt will set up appt to discuss labs and skin bx 2nd part of reports.

## 2019-07-28 NOTE — Progress Notes (Signed)
Alexandria Klein is a 69 y.o. female, evaluated via audio-only technology on 07/28/2019 for No chief complaint on file.  .    Assessment & Plan:   Diagnoses and all orders for this visit:    1. Small fiber neuropathy  -     VITAMIN B12  -     SPEP AND IFE, SERUM  -     VITAMIN B6; Future  -     ANGIOTENSIN CONVERTING ENZYME    2. Myalgia  -     CK; Future  -     SED RATE (ESR)  -     ALDOLASE; Future    3.  Weakness of both legs    Will need second set of skin biopsy to finalize diagnosis because of unusual distribution. Discussed IVIG.     12  Subjective:   Discussed skin bx result, positive on thigh sample, borderline above the ankle, normal on foot. All on the right side. For burning sensation from thighs all way down to feet.     Muscle ache and tender to touch, stumbles, weak in legs, used to ice skate, play with grandchildren, not anymore, real effort to walk. Back pain chronic, but symptoms began suddenly, woke up with weakness of legs felt like ran 50 miles, following day developed soreness and burning, negative in arms. Symptoms persisted 2 years and 2 months now, variable severity. Recalled a few weeks prior to event she and husband had severe respiratory infection, he had similar onset of above but milder and went away.      Prior to Admission medications    Not on File     Past Medical History:   Diagnosis Date   ??? Dysesthesia 07/15/2019   ??? Paresthesia of both lower extremities 06/13/2019    thighs       Review of Systems   Musculoskeletal: Positive for back pain and myalgias.   Neurological: Positive for tingling, sensory change and weakness.       No flowsheet data found.      Marcy Guettler, who was evaluated through a patient-initiated, synchronous (real-time) audio only encounter, and/or her healthcare decision maker, is aware that it is a billable service, with coverage as determined by her insurance carrier. She provided verbal consent to proceed: Yes. She has not had a related appointment within my department in the past 7 days or scheduled within the next 24 hours.      Total Time: minutes: 21-30 minutes    Oneta Rack, MD

## 2019-08-13 LAB — SPEP AND IFE, SERUM
A/G RATIO, 149531: 1.6 NA (ref 0.7–1.7)
A/G ratio: 1.6 (ref 0.7–1.7)
ALBUMIN, 149520: 3.9 g/dL (ref 2.9–4.4)
ALPHA-2-GLOBULIN: 0.7 g/dL (ref 0.4–1.0)
Albumin: 3.9 g/dL (ref 2.9–4.4)
Alpha-1-Globulin: 0.3 g/dL (ref 0.0–0.4)
Alpha-1-globulin: 0.3 g/dL (ref 0.0–0.4)
Alpha-2-Globulin: 0.7 g/dL (ref 0.4–1.0)
BETA GLOBULIN, 149523: 0.9 g/dL (ref 0.7–1.3)
Beta Globulin: 0.9 g/dL (ref 0.7–1.3)
GAMMA GLOBULIN, 149524: 0.6 g/dL (ref 0.4–1.8)
GLOBULIN, TOTAL: 2.5 g/dL (ref 2.2–3.9)
Gamma Globulin: 0.6 g/dL (ref 0.4–1.8)
Globulin, total: 2.5 g/dL (ref 2.2–3.9)
Immunoglobulin A, Qt.: 133 mg/dL (ref 87–352)
Immunoglobulin A, Qt.: 133 mg/dL (ref 87–352)
Immunoglobulin G, Qt.: 660 mg/dL (ref 586–1602)
Immunoglobulin G, Quantitative: 660 mg/dL (ref 586–1602)
Immunoglobulin M, Qt.: 56 mg/dL (ref 26–217)
Immunoglobulin M, Quantitative: 56 mg/dL (ref 26–217)
Protein, total: 6.4 g/dL (ref 6.0–8.5)
Total Protein: 6.4 g/dL (ref 6.0–8.5)

## 2019-08-13 LAB — ALDOLASE
ALDOLASE, 002030: 4.6 U/L (ref 3.3–10.3)
Aldolase: 4.6 U/L (ref 3.3–10.3)

## 2019-08-13 LAB — ANGIOTENSIN CONVERTING ENZYME
ACE,ACE: 35 U/L (ref 14–82)
Angiotensin Converting Enzyme (ACE): 35 U/L (ref 14–82)

## 2019-08-13 LAB — CK
Creatine Kinase,Total: 41 U/L (ref 32–182)
Total CK: 41 U/L (ref 32–182)

## 2019-08-13 LAB — VITAMIN B6
VITAMIN B6, 004656: 45.8 ug/L — ABNORMAL HIGH (ref 2.0–32.8)
Vitamin B6: 45.8 ug/L — ABNORMAL HIGH (ref 2.0–32.8)

## 2019-08-13 LAB — VITAMIN B12
Vitamin B-12: 388 pg/mL (ref 232–1245)
Vitamin B12: 388 pg/mL (ref 232–1245)

## 2019-08-13 LAB — SED RATE (ESR): Sed rate (ESR): 2 mm/hr (ref 0–40)

## 2019-08-13 LAB — SEDIMENTATION RATE: Sed Rate: 2 mm/hr (ref 0–40)

## 2019-08-19 ENCOUNTER — Ambulatory Visit: Attending: Clinical Neurophysiology | Primary: Family Medicine

## 2019-08-19 ENCOUNTER — Ambulatory Visit
Admit: 2019-08-19 | Discharge: 2019-08-19 | Payer: MEDICARE | Attending: Clinical Neurophysiology | Primary: Family Medicine

## 2019-08-19 ENCOUNTER — Inpatient Hospital Stay: Admit: 2019-08-19 | Payer: MEDICARE | Attending: Clinical Neurophysiology | Primary: Family Medicine

## 2019-08-19 DIAGNOSIS — G629 Polyneuropathy, unspecified: Secondary | ICD-10-CM

## 2019-08-19 DIAGNOSIS — M791 Myalgia, unspecified site: Secondary | ICD-10-CM

## 2019-08-19 LAB — HEMOGLOBIN A1C WITH EAG
Est. average glucose: 117 mg/dL
Hemoglobin A1c: 5.7 % (ref 4.20–6.30)

## 2019-08-19 LAB — CK
CK: 84 U/L (ref 21–215)
Total CK: 84 U/L (ref 21–215)

## 2019-08-19 LAB — HEMOGLOBIN A1C W/EAG
Hemoglobin A1C: 5.7 % (ref 4.20–6.30)
eAG: 117 mg/dL

## 2019-08-19 NOTE — Progress Notes (Signed)
Called and spoke with pt. Informed her of lab results, per Dr. Dimas Aguas. Pt has d/ced B6. Pt voiced understanding.

## 2019-08-19 NOTE — Progress Notes (Signed)
pls mail report to pt, all normal, thx

## 2019-08-19 NOTE — Progress Notes (Signed)
Please notify patient that labs A1c 5.7%, and CK 84, both were normal.

## 2019-08-19 NOTE — Progress Notes (Signed)
From 45 now 43.

## 2019-08-19 NOTE — Progress Notes (Signed)
Her B6 mildly high, please stop B6 supplement.

## 2019-08-19 NOTE — Progress Notes (Signed)
08/19/2019  Alexandria Klein 69 y.o. female      Chief Complaint:  Chief Complaint   Patient presents with   ??? Tingling          Followup Note:   We have discussed skin biopsy results, epidermal nerve density was reduced in thigh sample, borderline at the ankle and foot.  Subsequent sweat gland count was reduced on all sites and consistent with small fiber neuropathy.  She remains physically active.  Denied dizziness.    In January 2019 the couple had severe respiratory infection with hard coughing, 2-3 weeks later developed burning tingling hot or cold sensation in both legs, firstly in front thighs, a month later down to lower legs and feet. Her husband had similar symptoms only in thighs for 3-4 weeks and then resolved. He has LBP as baseline.     FH younger brother passed away from ALS at 34 year-old.     Review Test Results: I have reviewed imaging study and lab tests, discussed results with patient in detail. All questions were answered. 12 minutes spent.   MRI LUMBAR SPINE WITHOUT CONTRAST3/06/2019  [facility]  Result Impression   IMPRESSION:   1. ??Multilevel disc and posterior element degenerative changes in conjunction with this patient's pronounced convex LEFT lumbar scoliosis that is centered at L1-2 level.  See above for details.. ??       LUMBAR SPINE MRI  08/08/2019 3:20 PM EST     Recent nerve conduction study was normal, needle EMG of right lower limb and paraspinal muscles was normal.  She completed all labs, B12 borderline low, B6 was increased.  Hemoglobin A1c and autoimmune sensory polyneuropathy antibodies are pending.    Review of Systems:  Review of Systems   Musculoskeletal: Positive for back pain.   Neurological: Positive for tingling, sensory change and weakness.     Examination:  There were no vitals filed for this visit.     Physical Exam  Constitutional:       Appearance: She is normal weight.   Eyes:      Extraocular Movements: Extraocular movements intact.   Neck:       Musculoskeletal: Normal range of motion.   Pulmonary:      Effort: Pulmonary effort is normal.   Musculoskeletal: Normal range of motion.   Neurological:      Mental Status: She is alert and oriented to person, place, and time.      Motor: No weakness.      Coordination: Coordination normal.      Gait: Gait normal.   Psychiatric:         Mood and Affect: Mood normal.         Behavior: Behavior normal.         Thought Content: Thought content normal.         Judgment: Judgment normal.     Neurologic Exam     Mental Status   Oriented to person, place, and time.         Assessment / Plan:    1. Small fiber neuropathy  She will need a follow-up nerve conduction study in 1 year.  Complete labs as follows.  She is on Cymbalta, not able to tolerate Lyrica or gabapentin.  - HEMOGLOBIN A1C WITH EAG; Future  - MISC. 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      I have spent 45  min, greater than 50% of discussing and counseling with patient, for treatment and diagnostic plan review.

## 2019-08-19 NOTE — Progress Notes (Signed)
Called and spoke with pt. Informed her of lab results, per Dr. Zhou-Wang. Pt voiced understanding.

## 2019-08-19 NOTE — Progress Notes (Signed)
pls mail report to pt, all normal, thx

## 2019-08-19 NOTE — Progress Notes (Signed)
Please notify patient that labs A1c 5.7%, and CK 84, both were normal.

## 2019-08-19 NOTE — Progress Notes (Deleted)
Skin Biopsy Procedure Note:       Indication:        Consent: Written consent was completed        The patient was placed on an examining table to the {RIGHT/LEFT/BILATERAL:18474} side. The biopsy 2 sites of the {RIGHT/LEFT/BILATERAL:18474} {UPPER/LOWER:19370} limb were both steriled with Betadine swabs x 3, and then alcohol patch x 1. The first site was at {RIGHT/LEFT/BILATERAL:18474} foot above the EDB; the second site was at the same side of lateral lower leg, 10 cm above the lateral malleolus. The third site the {RIGHT/LEFT/BILATERAL:18474} pubis at thigh level. 2% Lidocaine was injected intra and subcutaneously in V shape on both sites. The biopsy punch and other instrument were provided by [employer]. About 3-4 mm diameter and 4-5 mm depth of dermis/ epidermis was sampled on the above sites, and placed into the solution of a prepared test tube provided by [employer]. The label of each tube was correctly matched with the sample. Minimal bleeding was easily stopped by pressure with sterile 4 x 4, then the each biopsy site was covered a wide Band-Aid.         Patient tolerated the procedure well.  Postprocedural instruction was given.  Patient was advised to call for any discomfort related to the procedure.

## 2019-08-19 NOTE — Progress Notes (Signed)
From 44 now 41.

## 2019-08-19 NOTE — Progress Notes (Signed)
Her B6 mildly high, please stop B6 supplement.

## 2019-08-19 NOTE — Progress Notes (Signed)
Called and spoke with pt. Informed her of lab results, per Dr. Zhou-Wang. Pt has d/ced B6. Pt voiced understanding.

## 2019-08-19 NOTE — Progress Notes (Signed)
08/19/2019  Alexandria Klein 69 y.o. female      Chief Complaint:  Chief Complaint   Patient presents with   ??? Tingling          Followup Note:   We have discussed skin biopsy results, epidermal nerve density was reduced in thigh sample, borderline at the ankle and foot.  Subsequent sweat gland count was reduced on all sites and consistent with small fiber neuropathy.  She remains physically active.  Denied dizziness.    In January 2019 the couple had severe respiratory infection with hard coughing, 2-3 weeks later developed burning tingling hot or cold sensation in both legs, firstly in front thighs, a month later down to lower legs and feet. Her husband had similar symptoms only in thighs for 3-4 weeks and then resolved. He has LBP as baseline.     FH younger brother passed away from ALS at 64 year-old.     Review Test Results: I have reviewed imaging study and lab tests, discussed results with patient in detail. All questions were answered. 12 minutes spent.   MRI LUMBAR SPINE WITHOUT CONTRAST3/06/2019  [facility]  Result Impression   IMPRESSION:   1. ??Multilevel disc and posterior element degenerative changes in conjunction with this patient's pronounced convex LEFT lumbar scoliosis that is centered at L1-2 level.  See above for details.. ??       LUMBAR SPINE MRI  08/08/2019 3:20 PM EST     Recent nerve conduction study was normal, needle EMG of right lower limb and paraspinal muscles was normal.  She completed all labs, B12 borderline low, B6 was increased.  Hemoglobin A1c and autoimmune sensory polyneuropathy antibodies are pending.    Review of Systems:  Review of Systems   Musculoskeletal: Positive for back pain.   Neurological: Positive for tingling, sensory change and weakness.     Examination:  There were no vitals filed for this visit.     Physical Exam  Constitutional:       Appearance: She is normal weight.   Eyes:      Extraocular Movements: Extraocular movements intact.   Neck:       Musculoskeletal: Normal range of motion.   Pulmonary:      Effort: Pulmonary effort is normal.   Musculoskeletal: Normal range of motion.   Neurological:      Mental Status: She is alert and oriented to person, place, and time.      Motor: No weakness.      Coordination: Coordination normal.      Gait: Gait normal.   Psychiatric:         Mood and Affect: Mood normal.         Behavior: Behavior normal.         Thought Content: Thought content normal.         Judgment: Judgment normal.     Neurologic Exam     Mental Status   Oriented to person, place, and time.         Assessment / Plan:    1. Small fiber neuropathy  She will need a follow-up nerve conduction study in 1 year.  Complete labs as follows.  She is on Cymbalta, not able to tolerate Lyrica or gabapentin.  - HEMOGLOBIN A1C WITH EAG; Future  - MISC. LAB TEST; Future    2. Spinal stenosis of lumbar region without neurogenic claudication  Followed by neurosurgeon at [facility].      I have spent 45   min, greater than 50% of discussing and counseling with patient, for treatment and diagnostic plan review.

## 2019-08-21 LAB — ALDOLASE
ALDOLASE, 002030: 5.3 U/L (ref 3.3–10.3)
Aldolase: 5.3 U/L (ref 3.3–10.3)

## 2019-08-25 LAB — VITAMIN B6
VITAMIN B6, 004656: 42.6 ug/L — ABNORMAL HIGH (ref 2.0–32.8)
Vitamin B6: 42.6 ug/L — ABNORMAL HIGH (ref 2.0–32.8)

## 2019-08-31 LAB — MISC. LAB TEST

## 2019-10-11 ENCOUNTER — Telehealth: Attending: Clinical Neurophysiology | Primary: Family Medicine

## 2019-10-11 ENCOUNTER — Telehealth
Admit: 2019-10-11 | Discharge: 2019-10-11 | Payer: MEDICARE | Attending: Clinical Neurophysiology | Primary: Family Medicine

## 2019-10-11 DIAGNOSIS — G629 Polyneuropathy, unspecified: Secondary | ICD-10-CM

## 2019-10-11 NOTE — Progress Notes (Signed)
Negative sjogren's antibodies. pls notify pt, thx. How about IVIG, has pt received?

## 2019-10-11 NOTE — Progress Notes (Signed)
Alexandria Klein is a 69 y.o. female who was seen by synchronous (real-time) audio-video technology on 10/11/2019 for No chief complaint on file.        Assessment & Plan:   Diagnoses and all orders for this visit:    1. Small fiber neuropathy  -     SJOGREN'S ABS, SSA AND SSB    2. 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 Onset was early 2019. Motor symptoms improved but small fiber neuropathic pain, imbalance and bilateral leg heaviness persisted. Patient is willing to try IVIG to eliminate inflammatory process so as to facilitate recovery of sensory and motor neuropathy.    I spent at least 30 minutes on this visit with this established patient.    Subjective:   Reviewed skin biopsy results and history again. Onset with severe respiratory infection, about 3-4 weeks after she developed neuropathic pain.  Her husband had a similar symptoms, but his neuropathy symptoms only lasted for a month or so.    Symptoms remained and affecting her daily life - burning stinging in thighs down to calves, and burning in feet. legs felt weak, walking slower and increased imbalance, had several falls.     NCS EMG normal on lower limbs.  Skin biopsy confirmed small fiber neuropathy.    Baseline scoliosis many years, chronic LBP.       Note from 07/28/2019:  Muscle ache and tender to touch, stumbles, weak in legs, used to ice skate, play with grandchildren, not anymore, real effort to walk. Back pain chronic, but symptoms began suddenly, woke up with weakness of legs felt like ran 50 miles, following day developed soreness and burning, negative in arms. Symptoms persisted 2 years and 2 months now, variable severity. Recalled a few weeks prior to event she and husband had severe respiratory infection, he had similar onset of above but milder and went away.        Reviewed all labs- normal, pending SSA/B. Last Cr 0.68 in January of this year.     Prior to  Admission medications    Not on File     Past Medical History:   Diagnosis Date   ??? Dysesthesia 07/15/2019   ??? Imbalance 10/11/2019   ??? Paresthesia of both lower extremities 06/13/2019    thighs   ??? Spinal stenosis of lumbar region without neurogenic claudication 08/19/2019       Review of Systems   Musculoskeletal: Positive for back pain.   Neurological: Positive for tingling and sensory change.        Imbalance       Objective:   No flowsheet data found.     [INSTRUCTIONS:  "[x] " Indicates a positive item  "[] " Indicates a negative item  -- DELETE ALL ITEMS NOT EXAMINED]    Constitutional: [x]  Appears well-developed and well-nourished [x]  No apparent distress      []  Abnormal -     Mental status: [x]  Alert and awake  [x]  Oriented to person/place/time [x]  Able to follow commands    []  Abnormal -     Eyes:   EOM    [x]   Normal    []  Abnormal -   Sclera  [x]   Normal    []  Abnormal -          Discharge []   None visible   []  Abnormal -     HENT: [x]  Normocephalic, atraumatic  []  Abnormal -   []  Mouth/Throat: Mucous membranes are  moist    External Ears []  Normal hearing []  Abnormal -    Neck: []  No visualized mass []  Abnormal -     Pulmonary/Chest: [x]  Respiratory effort normal   [x]  No visualized signs of difficulty breathing or respiratory distress        []  Abnormal -      Musculoskeletal:   [x]  Normal gait with no signs of ataxia         [x]  Normal range of motion of neck        []  Abnormal -     Neurological:        [x]  No Facial Asymmetry (Cranial nerve 7 motor function) (limited exam due to video visit)          [x]  No gaze palsy        []  Abnormal -          Skin:        []  No significant exanthematous lesions or discoloration noted on facial skin         []  Abnormal -            Psychiatric:       [x]  Normal Affect []  Abnormal -        [x]  No Hallucinations    Other pertinent observable physical exam findings:-        We discussed the expected course, resolution and complications of the diagnosis(es) in detail.   Medication risks, benefits, costs, interactions, and alternatives were discussed as indicated.  I advised her to contact the office if her condition worsens, changes or fails to improve as anticipated. She expressed understanding with the diagnosis(es) and plan.       Zayneb Swenor, was evaluated through a synchronous (real-time) audio-video encounter. The patient (or guardian if applicable) is aware that this is a billable service. Verbal consent to proceed has been obtained within the past 12 months. The visit was conducted pursuant to the emergency declaration under the and the , 1135 waiver authority and the and Act.  Patient identification was verified, and a caregiver was present when appropriate. The patient was located in a state where the provider was credentialed to provide care.      , MD

## 2019-10-11 NOTE — Progress Notes (Signed)
Negative sjogren's antibodies. pls notify pt, thx. How about IVIG, has pt received?

## 2019-10-11 NOTE — Progress Notes (Addendum)
Alexandria Klein is a 69 y.o. female who was seen by synchronous (real-time) audio-video technology on 10/11/2019 for No chief complaint on file.        Assessment & Plan:   Diagnoses and all orders for this visit:    1. Small fiber neuropathy  -     SJOGREN'S ABS, SSA AND SSB    2. 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 Onset was early 2019. Motor symptoms improved but small fiber neuropathic pain, imbalance and bilateral leg heaviness persisted. Patient is willing to try IVIG to eliminate inflammatory process so as to facilitate recovery of sensory and motor neuropathy.    I spent at least 30 minutes on this visit with this established patient.    Subjective:   Reviewed skin biopsy results and history again. Onset with severe respiratory infection, about 3-4 weeks after she developed neuropathic pain.  Her husband had a similar symptoms, but his neuropathy symptoms only lasted for a month or so.    Symptoms remained and affecting her daily life - burning stinging in thighs down to calves, and burning in feet. legs felt weak, walking slower and increased imbalance, had several falls.     NCS EMG normal on lower limbs.  Skin biopsy confirmed small fiber neuropathy.    Baseline scoliosis many years, chronic LBP.       Note from 07/28/2019:  Muscle ache and tender to touch, stumbles, weak in legs, used to ice skate, play with grandchildren, not anymore, real effort to walk. Back pain chronic, but symptoms began suddenly, woke up with weakness of legs felt like ran 50 miles, following day developed soreness and burning, negative in arms. Symptoms persisted 2 years and 2 months now, variable severity. Recalled a few weeks prior to event she and husband had severe respiratory infection, he had similar onset of above but milder and went away.        Reviewed all labs- normal, pending SSA/B. Last Cr 0.68 in January of this year.     Prior to  Admission medications    Not on File     Past Medical History:   Diagnosis Date   ??? Dysesthesia 07/15/2019   ??? Imbalance 10/11/2019   ??? Paresthesia of both lower extremities 06/13/2019    thighs   ??? Spinal stenosis of lumbar region without neurogenic claudication 08/19/2019       Review of Systems   Musculoskeletal: Positive for back pain.   Neurological: Positive for tingling and sensory change.        Imbalance       Objective:   No flowsheet data found.     [INSTRUCTIONS:  "[x]" Indicates a positive item  "[]" Indicates a negative item  -- DELETE ALL ITEMS NOT EXAMINED]    Constitutional: [x] Appears well-developed and well-nourished [x] No apparent distress      [] Abnormal -     Mental status: [x] Alert and awake  [x] Oriented to person/place/time [x] Able to follow commands    [] Abnormal -     Eyes:   EOM    [x]  Normal    [] Abnormal -   Sclera  [x]  Normal    [] Abnormal -          Discharge []  None visible   [] Abnormal -     HENT: [x] Normocephalic, atraumatic  [] Abnormal -   [] Mouth/Throat: Mucous membranes are   moist    External Ears []  Normal hearing []  Abnormal -    Neck: []  No visualized mass []  Abnormal -     Pulmonary/Chest: [x]  Respiratory effort normal   [x]  No visualized signs of difficulty breathing or respiratory distress        []  Abnormal -      Musculoskeletal:   [x]  Normal gait with no signs of ataxia         [x]  Normal range of motion of neck        []  Abnormal -     Neurological:        [x]  No Facial Asymmetry (Cranial nerve 7 motor function) (limited exam due to video visit)          [x]  No gaze palsy        []  Abnormal -          Skin:        []  No significant exanthematous lesions or discoloration noted on facial skin         []  Abnormal -            Psychiatric:       [x]  Normal Affect []  Abnormal -        [x]  No Hallucinations    Other pertinent observable physical exam findings:-        We discussed the expected course, resolution and complications of the diagnosis(es) in detail.   Medication risks, benefits, costs, interactions, and alternatives were discussed as indicated.  I advised her to contact the office if her condition worsens, changes or fails to improve as anticipated. She expressed understanding with the diagnosis(es) and plan.       Zayneb Swenor, was evaluated through a synchronous (real-time) audio-video encounter. The patient (or guardian if applicable) is aware that this is a billable service. Verbal consent to proceed has been obtained within the past 12 months. The visit was conducted pursuant to the emergency declaration under the and the , 1135 waiver authority and the and Act.  Patient identification was verified, and a caregiver was present when appropriate. The patient was located in a state where the provider was credentialed to provide care.      , MD

## 2019-10-18 LAB — SJOGREN'S ABS, SSA AND SSB
Sjogren's Anti-SS-A: <0.2 AI (ref 0.0–0.9)
Sjogren's Anti-SS-A: <0.2 AI (ref 0.0–0.9)
Sjogren's Anti-SS-B: <0.2 AI (ref 0.0–0.9)
Sjogren's Anti-SS-B: <0.2 AI (ref 0.0–0.9)

## 2019-11-11 NOTE — Telephone Encounter (Signed)
Called and spoke with pt. Informed her IVIG orders were faxed to [facility]. They will contact her once everything is approved and ready for her to schedule. Pt voiced understanding.

## 2019-11-11 NOTE — Telephone Encounter (Signed)
Pt called in wanting to know if the infusion that was discussed in her last virtual visit was approved. She states she was not sure what it was called but she was supposed to get a call about approval. Pls adv

## 2019-11-11 NOTE — Telephone Encounter (Signed)
Called and spoke with pt. Informed her IVIG orders were faxed to [facility]. They will contact her once everything is approved and ready for her to schedule. Pt voiced understanding.

## 2019-11-11 NOTE — Telephone Encounter (Signed)
Pt called in wanting to know if the infusion that was discussed in her last virtual visit was approved. She states she was not sure what it was called but she was supposed to get a call about approval. Pls adv

## 2020-06-06 NOTE — Telephone Encounter (Signed)
Patient and her doctors in [place] want her to try the Immunoglobulin Infusions again at [facility] and needs a new order sent in.    Did advise the patient that Dr. Dimas Aguas is out of the office until Monday.

## 2020-06-11 NOTE — Progress Notes (Signed)
Pt requesting infusion order for [facility].  Provider has signed order.   Faxed order to [facility] [phone]  Confirmation was received.

## 2020-07-24 NOTE — Telephone Encounter (Signed)
Patient reports that with her IVIG the new cost was going to be $572 a day. She cannot afford this and wants to see if there is a code that can be changed, patient assistance, another way to bill it, anything at all to help with the cost of this.

## 2020-07-24 NOTE — Telephone Encounter (Signed)
Ok can you print out the original order? tx

## 2020-07-25 NOTE — Telephone Encounter (Signed)
FU vv needed, thx

## 2020-07-25 NOTE — Telephone Encounter (Signed)
I spoke to [facility] and patient has new insurance this year. Last year her insurance covered IvIg 100%. This year her new insurance only covers 80% so patient is responsible for the other 20%. [facility] can check to see if there is a grant available for her diagnosis code. If not, there is payment plans.

## 2020-07-25 NOTE — Telephone Encounter (Signed)
Lmom to schedule VV at 5:30 pm on 2/17 with Dr. Dimas Aguas

## 2020-07-26 ENCOUNTER — Telehealth: Attending: Clinical Neurophysiology | Primary: Family Medicine

## 2020-07-26 ENCOUNTER — Telehealth
Admit: 2020-07-26 | Discharge: 2020-07-30 | Payer: MEDICARE | Attending: Clinical Neurophysiology | Primary: Family Medicine

## 2020-07-26 DIAGNOSIS — G608 Other hereditary and idiopathic neuropathies: Secondary | ICD-10-CM

## 2020-07-26 MED ORDER — CARBAMAZEPINE 100 MG CHEWABLE TAB
100 mg | ORAL_TABLET | Freq: Three times a day (TID) | ORAL | 0 refills | Status: DC
Start: 2020-07-26 — End: 2020-07-26

## 2020-07-26 MED ORDER — CARBAMAZEPINE 100 MG CHEWABLE TAB
100 mg | ORAL_TABLET | ORAL | 0 refills | Status: DC
Start: 2020-07-26 — End: 2020-08-30

## 2020-07-26 NOTE — Progress Notes (Signed)
Alexandria Klein is a 70 y.o. female who was seen by synchronous (real-time) audio-video technology on 07/26/2020 for No chief complaint on file.        Assessment & Plan:   Diagnoses and all orders for this visit:    1. Idiopathic small fiber sensory neuropathy    2. Chronic inflammatory axonal polyneuropathy (HCC)    3. Neuropathic pain  -     carBAMazepine (TEGretol) 100 mg chewable tablet; 1/2 tab tid x 3 days, may up to 1 tid to qid prn. Call for refill.    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  Patient was resistant to conventional medications for neuropathic pain.  Will give another round of IVIG 500 mg/kg x 4, followed by maintenance doses 2/mo x 6. She will try tegretol for pain.     I spent at least 40 minutes on this visit with this established patient.    Subjective:     Received IVIG 4 doses, completed last October 2021, recalled that the last dose seemed helped a little bit, but no obvious benefit the rest of the 3 months, no maintenance was given.     Burning stinging in legs, affecting coordination, balance and walk. Pain daily, level 4-9/10, variabale, good days and bad days. Affecting sleep.  Tried and failed gabapentin, lyrica, and cymbalta (good for LBP).    Reviewed hx, January 2019 the couple both had severe viral respiratory infection lasted about 3 weeks. Several weeks later, patient developed small fiber sensory polyneuopathy.     Received topical ketamine, gabapentin, baclofen etc. compound cream from [facility], helped at night. Also tried Namenda 5 mg, gave side effect of nausea and drowsiness. TENS units not helping.        Prior to Admission medications    Medication Sig Start Date End Date Taking? Authorizing Provider   carBAMazepine (TEGretol) 100 mg chewable tablet 1/2 tab tid x 3 days, may up to 1 tid to qid prn. Call for  refill. 07/26/20  Yes Katharina Caper, MD     Past Medical History:   Diagnosis Date   ??? 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 Dysesthesia 07/15/2019   ??? Idiopathic small fiber sensory neuropathy 07/26/2020   ??? Imbalance 10/11/2019   ??? Paresthesia of both lower extremities 06/13/2019    thighs   ??? Spinal stenosis of lumbar region without neurogenic claudication 08/19/2019     No past surgical history on file.    Review of Systems   Neurological: Positive for sensory change.         We discussed the expected course, resolution and complications of the diagnosis(es) in detail.  Medication risks, benefits, costs, interactions, and alternatives were discussed as indicated.  I advised her to contact the office if her condition worsens, changes or fails to improve as anticipated. She expressed understanding with the diagnosis(es) and plan.       [name], was evaluated through a synchronous (real-time) audio-video encounter. The patient (or guardian if applicable) is aware that this is a billable service, which includes applicable co-pays. Verbal consent to proceed has been obtained. The visit was conducted pursuant to the emergency declaration under the [employer] and the [employer], 1135 waiver authority and the [employer] Act.  Patient identification was verified, and a caregiver was  present when appropriate. The patient was located at home in a state where the provider was licensed to provide care.      Katharina Caper, MD

## 2020-08-01 NOTE — Telephone Encounter (Signed)
Kim from [facility] called stating that patient is very adamant that our office is billing things incorrectly which is the reason for the her 20% responsibility with the IVIG.     Selena Batten states that this is because of patient not having a supplement to her medicare plan anymore.     I advised her that the MA has already spoken with someone from [facility] & Dr. Dimas Aguas had a visit with her on 2/17.

## 2020-08-01 NOTE — Telephone Encounter (Signed)
I spoke to patient and informed her that we do not do the billing for the IvIg. We sent the orders with the exact same diagnosis code that we used the last time. Her insurance did change this year and she is responsible for 20%. Patient thinks its ridiculous that she has to pay $572 copay for each visit for the IvIg. She will call [employer] and speak to them about it.

## 2020-08-03 NOTE — Telephone Encounter (Signed)
Patient wants to know how far apart the IVIG doses will be. She needs to know so that she can plan for financial purposes.

## 2020-08-03 NOTE — Telephone Encounter (Signed)
Pt called back wanting to know how many days the maintenance infusion will be. Pls adv

## 2020-08-06 NOTE — Telephone Encounter (Signed)
2 doses per months for 6 months.

## 2020-08-07 NOTE — Telephone Encounter (Signed)
Patient informed.

## 2020-08-17 NOTE — Telephone Encounter (Signed)
Patient wants to hold off on the nerve study for right now and wants to see if Dr. Dimas Aguas really thinks she needs this repeat study since she knows her neuropathy is getting worse.    She also wants to discuss a referral to the [facility]

## 2020-08-17 NOTE — Telephone Encounter (Signed)
Right, no need to repeat study. Going for secondary opinion is a good idea.

## 2020-08-17 NOTE — Telephone Encounter (Signed)
LVM informing patient.

## 2020-08-20 ENCOUNTER — Encounter: Payer: MEDICARE | Attending: Clinical Neurophysiology | Primary: Family Medicine

## 2020-08-29 ENCOUNTER — Encounter

## 2020-08-29 NOTE — Progress Notes (Signed)
Patient is currently taking 100mg  1/2 tab TID and will titrate up to 1 tab TID.

## 2020-08-30 ENCOUNTER — Encounter

## 2020-08-30 NOTE — Progress Notes (Signed)
Pls attached, need to release records skin biopsy and [facility] EMG reports and procedure original date.

## 2020-08-31 MED ORDER — CARBAMAZEPINE 100 MG CHEWABLE TAB
100 mg | ORAL_TABLET | ORAL | 2 refills | Status: AC
Start: 2020-08-31 — End: ?
